# Patient Record
Sex: Female | Born: 1980 | Race: White | Hispanic: No | Marital: Single | Smoking: Current every day smoker
Health system: Southern US, Community
[De-identification: ages and names within clinical notes are randomized; demographics above are authoritative.]

## PROBLEM LIST (undated history)

## (undated) DIAGNOSIS — N200 Calculus of kidney: Secondary | ICD-10-CM

## (undated) DIAGNOSIS — Q359 Cleft palate, unspecified: Secondary | ICD-10-CM

## (undated) DIAGNOSIS — Z9889 Other specified postprocedural states: Secondary | ICD-10-CM

---

## 2004-08-07 ENCOUNTER — Emergency Department: Payer: Self-pay | Admitting: Emergency Medicine

## 2004-09-14 ENCOUNTER — Emergency Department: Payer: Self-pay | Admitting: Emergency Medicine

## 2005-03-30 ENCOUNTER — Emergency Department: Payer: Self-pay | Admitting: Emergency Medicine

## 2005-05-10 ENCOUNTER — Emergency Department: Payer: Self-pay | Admitting: Emergency Medicine

## 2006-01-08 ENCOUNTER — Emergency Department: Payer: Self-pay | Admitting: Internal Medicine

## 2006-01-11 ENCOUNTER — Emergency Department: Payer: Self-pay | Admitting: General Practice

## 2006-01-14 ENCOUNTER — Ambulatory Visit: Payer: Self-pay | Admitting: Emergency Medicine

## 2006-12-11 ENCOUNTER — Emergency Department: Payer: Self-pay

## 2007-08-26 ENCOUNTER — Emergency Department: Payer: Self-pay | Admitting: Internal Medicine

## 2008-01-17 ENCOUNTER — Emergency Department: Payer: Self-pay | Admitting: Emergency Medicine

## 2008-07-07 ENCOUNTER — Emergency Department: Payer: Self-pay | Admitting: Emergency Medicine

## 2008-08-23 ENCOUNTER — Emergency Department: Payer: Self-pay | Admitting: Internal Medicine

## 2009-03-02 ENCOUNTER — Emergency Department: Payer: Self-pay | Admitting: Emergency Medicine

## 2009-12-03 ENCOUNTER — Emergency Department: Payer: Self-pay | Admitting: Internal Medicine

## 2010-03-02 ENCOUNTER — Emergency Department: Payer: Self-pay | Admitting: Emergency Medicine

## 2010-03-07 ENCOUNTER — Emergency Department: Payer: Self-pay | Admitting: Emergency Medicine

## 2010-03-12 ENCOUNTER — Ambulatory Visit: Payer: Self-pay | Admitting: Urology

## 2010-03-13 ENCOUNTER — Ambulatory Visit: Payer: Self-pay | Admitting: Urology

## 2010-09-10 ENCOUNTER — Emergency Department: Payer: Self-pay | Admitting: Emergency Medicine

## 2010-12-13 ENCOUNTER — Emergency Department (HOSPITAL_COMMUNITY)
Admission: EM | Admit: 2010-12-13 | Discharge: 2010-12-13 | Disposition: A | Discharge status: Home / Self Care | Payer: Self-pay | Attending: Emergency Medicine | Admitting: Emergency Medicine

## 2010-12-13 ENCOUNTER — Encounter (HOSPITAL_COMMUNITY): Payer: Self-pay | Admitting: Radiology

## 2010-12-13 ENCOUNTER — Emergency Department (HOSPITAL_COMMUNITY): Payer: Self-pay

## 2010-12-13 DIAGNOSIS — M545 Low back pain, unspecified: Secondary | ICD-10-CM | POA: Insufficient documentation

## 2010-12-13 DIAGNOSIS — R319 Hematuria, unspecified: Secondary | ICD-10-CM | POA: Insufficient documentation

## 2010-12-13 DIAGNOSIS — R109 Unspecified abdominal pain: Secondary | ICD-10-CM | POA: Insufficient documentation

## 2010-12-13 HISTORY — DX: Calculus of kidney: N20.0

## 2010-12-13 HISTORY — DX: Other specified postprocedural states: Z98.890

## 2010-12-13 LAB — POCT I-STAT, CHEM 8
Creatinine, Ser: 0.8 mg/dL (ref 0.4–1.2)
HCT: 59 % — ABNORMAL HIGH (ref 36.0–46.0)
Hemoglobin: 20.1 g/dL — ABNORMAL HIGH (ref 12.0–15.0)
Potassium: 3.8 mEq/L (ref 3.5–5.1)
Sodium: 138 mEq/L (ref 135–145)

## 2010-12-13 LAB — URINALYSIS, ROUTINE W REFLEX MICROSCOPIC
Bilirubin Urine: NEGATIVE
Ketones, ur: NEGATIVE mg/dL
Nitrite: NEGATIVE
Protein, ur: NEGATIVE mg/dL
pH: 7 (ref 5.0–8.0)

## 2010-12-13 LAB — POCT PREGNANCY, URINE: Preg Test, Ur: NEGATIVE

## 2010-12-13 LAB — URINE MICROSCOPIC-ADD ON

## 2011-02-02 ENCOUNTER — Emergency Department (HOSPITAL_COMMUNITY): Payer: Self-pay

## 2011-02-02 ENCOUNTER — Emergency Department (HOSPITAL_COMMUNITY)
Admission: EM | Admit: 2011-02-02 | Discharge: 2011-02-02 | Disposition: A | Discharge status: Home / Self Care | Payer: Self-pay | Attending: Emergency Medicine | Admitting: Emergency Medicine

## 2011-02-02 DIAGNOSIS — G8929 Other chronic pain: Secondary | ICD-10-CM | POA: Insufficient documentation

## 2011-02-02 DIAGNOSIS — R079 Chest pain, unspecified: Secondary | ICD-10-CM | POA: Insufficient documentation

## 2011-02-02 DIAGNOSIS — M546 Pain in thoracic spine: Secondary | ICD-10-CM | POA: Insufficient documentation

## 2011-02-02 DIAGNOSIS — W19XXXA Unspecified fall, initial encounter: Secondary | ICD-10-CM | POA: Insufficient documentation

## 2011-02-02 DIAGNOSIS — Y929 Unspecified place or not applicable: Secondary | ICD-10-CM | POA: Insufficient documentation

## 2011-07-07 ENCOUNTER — Emergency Department: Payer: Self-pay | Admitting: Emergency Medicine

## 2011-07-29 ENCOUNTER — Emergency Department: Payer: Self-pay | Admitting: Emergency Medicine

## 2012-09-03 ENCOUNTER — Encounter (HOSPITAL_COMMUNITY): Payer: Self-pay | Admitting: Emergency Medicine

## 2012-09-03 ENCOUNTER — Emergency Department (HOSPITAL_COMMUNITY)
Admission: EM | Admit: 2012-09-03 | Discharge: 2012-09-03 | Disposition: A | Discharge status: Home / Self Care | Payer: Self-pay | Attending: Emergency Medicine | Admitting: Emergency Medicine

## 2012-09-03 DIAGNOSIS — R21 Rash and other nonspecific skin eruption: Secondary | ICD-10-CM | POA: Insufficient documentation

## 2012-09-03 DIAGNOSIS — Z87442 Personal history of urinary calculi: Secondary | ICD-10-CM | POA: Insufficient documentation

## 2012-09-03 DIAGNOSIS — K0889 Other specified disorders of teeth and supporting structures: Secondary | ICD-10-CM

## 2012-09-03 DIAGNOSIS — K089 Disorder of teeth and supporting structures, unspecified: Secondary | ICD-10-CM | POA: Insufficient documentation

## 2012-09-03 MED ORDER — HYDROCODONE-ACETAMINOPHEN 5-325 MG PO TABS
1.0000 | ORAL_TABLET | Freq: Once | ORAL | Status: AC
  Administered 2012-09-03: 1 via ORAL
  Filled 2012-09-03: qty 1

## 2012-09-03 MED ORDER — HYDROCODONE-ACETAMINOPHEN 5-500 MG PO TABS: 1.0000 | ORAL_TABLET | Freq: Four times a day (QID) | ORAL | Status: DC | PRN

## 2012-09-03 MED ORDER — PENICILLIN V POTASSIUM 500 MG PO TABS: 500.0000 mg | ORAL_TABLET | Freq: Three times a day (TID) | ORAL | Status: DC

## 2012-09-03 MED ORDER — PENICILLIN V POTASSIUM 250 MG PO TABS
500.0000 mg | ORAL_TABLET | Freq: Once | ORAL | Status: AC
  Administered 2012-09-03: 500 mg via ORAL
  Filled 2012-09-03: qty 2

## 2012-09-03 NOTE — ED Provider Notes (Signed)
Medical screening examination/treatment/procedure(s) were performed by non-physician practitioner and as supervising physician I was immediately available for consultation/collaboration.   Bernerd Terhune H Neilan Rizzo, MD 09/03/12 1532 

## 2012-09-03 NOTE — ED Notes (Signed)
Pt c/o left upper dental pain x 3 days 

## 2012-09-03 NOTE — ED Provider Notes (Signed)
History     CSN: [id number]  Arrival date & time 09/03/12  4098   First MD Initiated Contact with Patient 09/03/12 [phone]      Chief Complaint  Patient presents with  . Dental Pain    (Consider location/radiation/quality/duration/timing/severity/associated sxs/prior treatment) HPI  31 year old female presents complaining of dental pain.  Patient reports she has had dental avulsion and dental decay to the left upper tooth in the past several months. 3 days ago while eating she accidentally chipped the same tooth.  Has been having sharp throbbing pain since.  Onset is gradual, intermittent but now persistent, moderate in severity, non radiating, worsening with eating or talking.  Denies fever, headache, ear pain, rash, n/v/d.  Has not been seen by a dentist.     Past Medical History  Diagnosis Date  . Kidney calculi   . History of lithotripsy     History reviewed. No pertinent past surgical history.  History reviewed. No pertinent family history.  History  Substance Use Topics  . Smoking status: Not on file  . Smokeless tobacco: Not on file  . Alcohol Use: Not on file    OB History    Grav Para Term Preterm Abortions TAB SAB Ect Mult Living                  Review of Systems  Constitutional: Negative for fever.  Musculoskeletal: Negative for myalgias, joint swelling and arthralgias.  Skin: Positive for rash. Negative for wound.  Neurological: Negative for numbness and headaches.    Allergies  Review of patient's allergies indicates no known allergies.  Home Medications  No current outpatient prescriptions on file.  BP 118/70  Pulse 99  Temp 98.6 F (37 C) (Oral)  Resp 18  SpO2 100%  Physical Exam  Nursing note and vitals reviewed. Constitutional: She is oriented to person, place, and time. She appears well-developed and well-nourished. No distress.  HENT:  Head: Atraumatic.  Mouth/Throat: Oropharynx is clear and moist.    Eyes: Conjunctivae normal  are normal.  Neck: Normal range of motion. Neck supple.  Musculoskeletal: Normal range of motion.  Lymphadenopathy:    She has no cervical adenopathy.  Neurological: She is alert and oriented to person, place, and time.  Skin: Skin is warm. No rash noted.  Psychiatric: She has a normal mood and affect.    ED Course  Procedures (including critical care time)  Labs Reviewed - No data to display No results found.   No diagnosis found.  1. Dental pain  MDM  Tenderness to L upper canine with evidence of dental decay.  Will d/c with dental f/u, PCN and pain med.  Pt otherwise has no trismus, no deep tissue infection.     BP 118/70  Pulse 99  Temp 98.6 F (37 C) (Oral)  Resp 18  SpO2 100%       Fayrene Helper, PA-C 09/03/12 1002

## 2012-10-28 ENCOUNTER — Encounter (HOSPITAL_COMMUNITY): Payer: Self-pay | Admitting: *Deleted

## 2012-10-28 ENCOUNTER — Emergency Department (HOSPITAL_COMMUNITY)
Admission: EM | Admit: 2012-10-28 | Discharge: 2012-10-28 | Disposition: A | Discharge status: Home / Self Care | Payer: Self-pay | Attending: Emergency Medicine | Admitting: Emergency Medicine

## 2012-10-28 DIAGNOSIS — Z87442 Personal history of urinary calculi: Secondary | ICD-10-CM | POA: Insufficient documentation

## 2012-10-28 DIAGNOSIS — K089 Disorder of teeth and supporting structures, unspecified: Secondary | ICD-10-CM | POA: Insufficient documentation

## 2012-10-28 DIAGNOSIS — K0889 Other specified disorders of teeth and supporting structures: Secondary | ICD-10-CM

## 2012-10-28 DIAGNOSIS — F17200 Nicotine dependence, unspecified, uncomplicated: Secondary | ICD-10-CM | POA: Insufficient documentation

## 2012-10-28 MED ORDER — OXYCODONE-ACETAMINOPHEN 5-325 MG PO TABS: 1.0000 | ORAL_TABLET | Freq: Four times a day (QID) | ORAL | Status: DC | PRN

## 2012-10-28 MED ORDER — PENICILLIN V POTASSIUM 500 MG PO TABS: 500.0000 mg | ORAL_TABLET | Freq: Four times a day (QID) | ORAL | Status: AC

## 2012-10-28 NOTE — ED Provider Notes (Signed)
History     CSN: [id number]  Arrival date & time 10/28/12  1337   First MD Initiated Contact with Patient 10/28/12 1512      Chief Complaint  Patient presents with  . Dental Pain    (Consider location/radiation/quality/duration/timing/severity/associated sxs/prior treatment) HPI 32 year old female presents complaining of dental pain. Patient reports she has had dental avulsion and dental decay to the left upper tooth in the past several months. She accidentally chipped the same tooth. Has been having sharp throbbing pain since. Pt has been seen once before for this tooth. She says that she does now have dentist but that it is going to take some time to get in to the dentist and it has started to hurt very badly now. Onset is gradual, intermittent but now persistent, moderate in severity, non radiating, worsening with eating or talking. Denies fever, headache, ear pain, rash, n/v/d. Has not been seen by a dentist.     Past Medical History  Diagnosis Date  . Kidney calculi   . History of lithotripsy     History reviewed. No pertinent past surgical history.  No family history on file.  History  Substance Use Topics  . Smoking status: Current Every Day Smoker  . Smokeless tobacco: Not on file  . Alcohol Use: No    OB History    Grav Para Term Preterm Abortions TAB SAB Ect Mult Living                  Review of Systems  Review of Systems  Gen: no weight loss, fevers, chills, night sweats  Eyes: no discharge or drainage, no occular pain or visual changes  Nose: no epistaxis or rhinorrhea  Mouth: + dental pain, no sore throat  Neck: no neck pain  Lungs:No wheezing, coughing or hemoptysis CV: no chest pain, palpitations, dependent edema or orthopnea  Abd: no abdominal pain, nausea, vomiting  GU: no dysuria or gross hematuria  MSK:  No abnormalities  Neuro: no headache, no focal neurologic deficits  Skin: no abnormalities Psyche: negative.   Allergies   Hydrocodone  Home Medications  No current outpatient prescriptions on file.  BP 121/78  Pulse 94  Temp 98.8 F (37.1 C) (Oral)  Resp 18  SpO2 100%  Physical Exam  Constitutional: She appears well-developed and well-nourished. No distress.  HENT:  Head: Normocephalic and atraumatic.  Mouth/Throat: Uvula is midline, oropharynx is clear and moist and mucous membranes are normal. Normal dentition. Dental caries (Pts tooth shows no obvious abscess but moderate to severe tenderness to palpation of marked tooth) present. No uvula swelling.    Eyes: Pupils are equal, round, and reactive to light.  Neck: Trachea normal, normal range of motion and full passive range of motion without pain. Neck supple.  Cardiovascular: Normal rate, regular rhythm, normal heart sounds and normal pulses.   Pulmonary/Chest: Effort normal and breath sounds normal. No respiratory distress. Chest wall is not dull to percussion. She exhibits no tenderness, no crepitus, no edema, no deformity and no retraction.  Abdominal: Normal appearance.  Musculoskeletal: Normal range of motion.  Neurological: She is alert. She has normal strength.  Skin: Skin is warm, dry and intact. She is not diaphoretic.  Psychiatric: She has a normal mood and affect. Her speech is normal. Cognition and memory are normal.    ED Course  Procedures (including critical care time)  Labs Reviewed - No data to display No results found.   No diagnosis found. Dx; toothache  MDM  Pt given Rx for Percocets 5-325 (10 tabs) and Penicillin. Patient informed that they need to find a dentist and have the tooth pulled or the symptoms may be reoccurring. A Resource guide has been given with dental providers. Patient has been given return to ED precautions.         Dorthula Matas, PA 10/28/12 1527

## 2012-10-28 NOTE — ED Provider Notes (Signed)
Medical screening examination/treatment/procedure(s) were performed by non-physician practitioner and as supervising physician I was immediately available for consultation/collaboration.   Rolan Bucco, MD 10/28/12 1600

## 2012-10-28 NOTE — ED Notes (Signed)
Pt is here with right upper tooth pain where pecan pie broke tooth off

## 2012-12-06 ENCOUNTER — Emergency Department: Payer: Self-pay | Admitting: Emergency Medicine

## 2012-12-06 LAB — URINALYSIS, COMPLETE
Bilirubin,UR: NEGATIVE
Glucose,UR: NEGATIVE mg/dL (ref 0–75)
Nitrite: NEGATIVE
RBC,UR: 3 /HPF (ref 0–5)
Specific Gravity: 1.03 (ref 1.003–1.030)

## 2012-12-06 LAB — CBC
MCH: 30.7 pg (ref 26.0–34.0)
Platelet: 299 10*3/uL (ref 150–440)
RDW: 12.9 % (ref 11.5–14.5)
WBC: 10.1 10*3/uL (ref 3.6–11.0)

## 2012-12-06 LAB — HCG, QUANTITATIVE, PREGNANCY: Beta Hcg, Quant.: 4809 m[IU]/mL — ABNORMAL HIGH

## 2012-12-06 LAB — WET PREP, GENITAL

## 2013-10-16 ENCOUNTER — Encounter (HOSPITAL_COMMUNITY): Payer: Self-pay | Admitting: Emergency Medicine

## 2013-10-16 ENCOUNTER — Emergency Department (HOSPITAL_COMMUNITY)
Admission: EM | Admit: 2013-10-16 | Discharge: 2013-10-16 | Disposition: A | Discharge status: Home / Self Care | Payer: Medicaid Other | Attending: Emergency Medicine | Admitting: Emergency Medicine

## 2013-10-16 DIAGNOSIS — Q359 Cleft palate, unspecified: Secondary | ICD-10-CM | POA: Insufficient documentation

## 2013-10-16 DIAGNOSIS — Z8709 Personal history of other diseases of the respiratory system: Secondary | ICD-10-CM | POA: Insufficient documentation

## 2013-10-16 DIAGNOSIS — F17200 Nicotine dependence, unspecified, uncomplicated: Secondary | ICD-10-CM | POA: Insufficient documentation

## 2013-10-16 DIAGNOSIS — K0889 Other specified disorders of teeth and supporting structures: Secondary | ICD-10-CM

## 2013-10-16 DIAGNOSIS — Z87442 Personal history of urinary calculi: Secondary | ICD-10-CM | POA: Insufficient documentation

## 2013-10-16 DIAGNOSIS — K089 Disorder of teeth and supporting structures, unspecified: Secondary | ICD-10-CM | POA: Insufficient documentation

## 2013-10-16 MED ORDER — OXYCODONE-ACETAMINOPHEN 5-325 MG PO TABS: 1.0000 | ORAL_TABLET | ORAL | Status: DC | PRN

## 2013-10-16 MED ORDER — PENICILLIN V POTASSIUM 500 MG PO TABS: 500.0000 mg | ORAL_TABLET | Freq: Four times a day (QID) | ORAL | Status: AC

## 2013-10-16 MED ORDER — OXYCODONE-ACETAMINOPHEN 5-325 MG PO TABS
1.0000 | ORAL_TABLET | Freq: Once | ORAL | Status: AC
  Administered 2013-10-16: 1 via ORAL
  Filled 2013-10-16: qty 1

## 2013-10-16 NOTE — ED Notes (Signed)
Pt. Stated, My wholert. Side of my jaw hurts.  I didn't have bad teeth til i was pregnant with my daughter.

## 2013-10-16 NOTE — ED Provider Notes (Signed)
CSN: [id number]     Arrival date & time 10/16/13  1050 History  This chart was scribed for non-physician practitioner, Jillyn Ledger, PA-C, working with Suzi Roots, MD by Smiley Houseman, ED Scribe. This patient was seen in room TR06C/TR06C and the patient's care was started at 11:47 AM.   Chief Complaint  Patient presents with  . Dental Pain   The history is provided by the patient. No language interpreter was used.   HPI Comments: Angelica Sampson is a 32 y.o. female with a PMH of kidney calculi who presents to the Emergency Department complaining of constant diffuse severe right sided dental pain, which began this morning. Pain is located in the right upper and lower jaw. She reports that she has a decayed tooth in her right upper mouth.  Patient has a cleft palate and states her voice is muffled at baseline with no acute changes.  Patient states she had a throat infection in 2010, which left her in this condition.  Patient denies any fever, chills, abdominal pain, vomiting, headache, and dysphagia. Patient is a smoker and contracted gum disease while she was pregnant with her son.  Patient reports that she gets nausea and pruritis after taking hydrocodone.    Past Medical History  Diagnosis Date  . Kidney calculi   . History of lithotripsy    History reviewed. No pertinent past surgical history. No family history on file. History  Substance Use Topics  . Smoking status: Current Every Day Smoker  . Smokeless tobacco: Not on file  . Alcohol Use: No   OB History   Grav Para Term Preterm Abortions TAB SAB Ect Mult Living                 Review of Systems  Constitutional: Negative for fever and chills.  HENT: Positive for dental problem. Negative for trouble swallowing.   Gastrointestinal: Negative for nausea, vomiting and abdominal pain.  Neurological: Negative for headaches.  All other systems reviewed and are negative.   Allergies  Hydrocodone  Home Medications    Current Outpatient Rx  Name  Route  Sig  Dispense  Refill  . ibuprofen (ADVIL,MOTRIN) 200 MG tablet   Oral   Take 400 mg by mouth every 6 (six) hours as needed for fever or moderate pain.          Triage Vitals: BP 130/87  Pulse 86  Temp(Src) 98.2 F (36.8 C) (Oral)  Resp 17  Wt 131 lb 2 oz (59.478 kg)  SpO2 100%  LMP 11/16/2012  Filed Vitals:   10/16/13 1056  BP: 130/87  Pulse: 86  Temp: 98.2 F (36.8 C)  TempSrc: Oral  Resp: 17  Weight: 131 lb 2 oz (59.478 kg)  SpO2: 100%     Physical Exam  Nursing note and vitals reviewed. Constitutional: She is oriented to person, place, and time. She appears well-developed and well-nourished. No distress.  HENT:  Head: Normocephalic and atraumatic.  Right Ear: External ear normal.  Left Ear: External ear normal.  Nose: Nose normal.  Mouth/Throat: Oropharynx is clear and moist. No oropharyngeal exudate.    Poor dentition throughout.  No dental abscess.  Patient reports pain with palpation to the molars on the upper right side.  No loose dentition/trauma.  No tenderness to palpation to the face throughout.  No palpable masses/edema.  TM's gray and translucent bilaterally.  No trismus.  Posterior pharynx is absent with no uvula.  Nasal voice.    Eyes:  Conjunctivae and EOM are normal. Pupils are equal, round, and reactive to light. Right eye exhibits no discharge. Left eye exhibits no discharge.  Neck: Normal range of motion. Neck supple. No tracheal deviation present.  No tenderness to palpation to the neck throughout.  No palpable masses/LAD.  No submental fullness.    Cardiovascular: Normal rate, regular rhythm and normal heart sounds.  Exam reveals no gallop and no friction rub.   No murmur heard. Pulmonary/Chest: Effort normal. No respiratory distress. She has no wheezes. She has no rales. She exhibits no tenderness.  Abdominal: Soft. She exhibits no distension. There is no tenderness.  Musculoskeletal: Normal range of  motion. She exhibits no edema and no tenderness.  Neurological: She is alert and oriented to person, place, and time.  Skin: Skin is warm and dry. She is not diaphoretic.  Psychiatric: She has a normal mood and affect. Her behavior is normal.    ED Course  Procedures (including critical care time) DIAGNOSTIC STUDIES: Oxygen Saturation is 100% on room air, normal by my interpretation.    COORDINATION OF CARE: 11:59 AM-Will give antibiotics and oxycodone for pain.  Informed patient to bottle feed while on the antibiotics and pain narcotics.  Will provide referral to dentistry.  Patient informed of current plan of treatment and evaluation and agrees with plan.     MDM   BRELAND ELDERS is a 32 y.o. female with a PMH of kidney calculi who presents to the Emergency Department complaining of constant diffuse severe right sided dental pain, which began this morning.   Etiology of dental pain possibly due to dental caries.  No concerning signs/symptoms for Ludwig's Angina at this time.  No dental abscess.  Patient afebrile and non-toxic in appearance.  Patient instructed to follow-up with a dentist for further evaluation and management.  Resources provided.  Return precautions were given.  Patient in agreement with discharge and plan. Patient obtaining ride home from ED.     Discharge Medication List as of 10/16/2013 12:03 PM    START taking these medications   Details  oxyCODONE-acetaminophen (PERCOCET/ROXICET) 5-325 MG per tablet Take 1-2 tablets by mouth every 4 (four) hours as needed for severe pain., Starting 10/16/2013, Until Discontinued, Print    penicillin v potassium (VEETID) 500 MG tablet Take 1 tablet (500 mg total) by mouth 4 (four) times daily., Starting 10/16/2013, Last dose on Sat 10/23/13, Print        Final impressions: 1. Pain, dental      Thomasenia Sales    I personally performed the services described in this documentation, which was scribed in my  presence. The recorded information has been reviewed and is accurate.       Jillyn Ledger, PA-C 10/17/13 2047

## 2013-10-19 NOTE — ED Provider Notes (Signed)
Medical screening examination/treatment/procedure(s) were performed by non-physician practitioner and as supervising physician I was immediately available for consultation/collaboration.  EKG Interpretation   None         Suzi Roots, MD 10/19/13 [phone]

## 2014-01-15 ENCOUNTER — Emergency Department: Payer: Self-pay | Admitting: Emergency Medicine

## 2014-10-04 ENCOUNTER — Emergency Department: Payer: Self-pay | Admitting: Internal Medicine

## 2015-03-20 DIAGNOSIS — K088 Other specified disorders of teeth and supporting structures: Secondary | ICD-10-CM | POA: Diagnosis present

## 2015-03-20 DIAGNOSIS — Z720 Tobacco use: Secondary | ICD-10-CM | POA: Insufficient documentation

## 2015-03-20 DIAGNOSIS — K029 Dental caries, unspecified: Secondary | ICD-10-CM | POA: Insufficient documentation

## 2015-03-21 ENCOUNTER — Emergency Department
Admission: EM | Admit: 2015-03-21 | Discharge: 2015-03-21 | Disposition: A | Discharge status: Home / Self Care | Payer: Medicaid Other | Attending: Emergency Medicine | Admitting: Emergency Medicine

## 2015-03-21 DIAGNOSIS — K029 Dental caries, unspecified: Secondary | ICD-10-CM

## 2015-03-21 MED ORDER — AMOXICILLIN 500 MG PO CAPS
500.0000 mg | ORAL_CAPSULE | Freq: Once | ORAL | Status: AC
  Administered 2015-03-21: 500 mg via ORAL

## 2015-03-21 MED ORDER — LIDOCAINE VISCOUS 2 % MT SOLN
15.0000 mL | Freq: Once | OROMUCOSAL | Status: AC
  Administered 2015-03-21: 15 mL via OROMUCOSAL

## 2015-03-21 MED ORDER — KETOROLAC TROMETHAMINE 10 MG PO TABS
10.0000 mg | ORAL_TABLET | Freq: Once | ORAL | Status: AC
  Administered 2015-03-21: 10 mg via ORAL

## 2015-03-21 MED ORDER — AMOXICILLIN 500 MG PO CAPS
ORAL_CAPSULE | ORAL | Status: AC
  Administered 2015-03-21: 500 mg via ORAL
  Filled 2015-03-21: qty 1

## 2015-03-21 MED ORDER — KETOROLAC TROMETHAMINE 10 MG PO TABS: 10.0000 mg | ORAL_TABLET | Freq: Three times a day (TID) | ORAL | Status: DC | PRN

## 2015-03-21 MED ORDER — AMOXICILLIN 500 MG PO CAPS: 500.0000 mg | ORAL_CAPSULE | Freq: Two times a day (BID) | ORAL | Status: DC

## 2015-03-21 MED ORDER — KETOROLAC TROMETHAMINE 10 MG PO TABS
ORAL_TABLET | ORAL | Status: AC
  Administered 2015-03-21: 10 mg via ORAL
  Filled 2015-03-21: qty 1

## 2015-03-21 MED ORDER — LIDOCAINE VISCOUS 2 % MT SOLN
OROMUCOSAL | Status: AC
Start: 2015-03-21 — End: 2015-03-21
  Administered 2015-03-21: 15 mL via OROMUCOSAL
  Filled 2015-03-21: qty 15

## 2015-03-21 NOTE — ED Provider Notes (Signed)
[facility] Emergency Department Provider Note  ____________________________________________  Time seen: 3:30 AM  I have reviewed the triage vital signs and the nursing notes.   HISTORY  Chief Complaint Dental Pain      HPI Angelica Sampson is a 34 y.o. female presents with multiple painful dental caries 2 weeks. Patient denies any fever no difficulty swallowing no nausea no vomiting     Past Medical History  Diagnosis Date  . Kidney calculi   . History of lithotripsy     There are no active problems to display for this patient.   No past surgical history on file.  Current Outpatient Rx  Name  Route  Sig  Dispense  Refill  . amoxicillin (AMOXIL) 500 MG capsule   Oral   Take 1 capsule (500 mg total) by mouth 2 (two) times daily.   20 capsule   0   . ibuprofen (ADVIL,MOTRIN) 200 MG tablet   Oral   Take 400 mg by mouth every 6 (six) hours as needed for fever or moderate pain.         Marland Kitchen. ketorolac (TORADOL) 10 MG tablet   Oral   Take 1 tablet (10 mg total) by mouth every 8 (eight) hours as needed.   20 tablet   0   . oxyCODONE-acetaminophen (PERCOCET/ROXICET) 5-325 MG per tablet   Oral   Take 1-2 tablets by mouth every 4 (four) hours as needed for severe pain.   10 tablet   0     Allergies Hydrocodone  No family history on file.  Social History History  Substance Use Topics  . Smoking status: Current Every Day Smoker  . Smokeless tobacco: Not on file  . Alcohol Use: No    Review of Systems  Constitutional: Negative for fever. Eyes: Negative for visual changes. ENT: Negative for sore throat. Positive dental pain Cardiovascular: Negative for chest pain. Respiratory: Negative for shortness of breath. Gastrointestinal: Negative for abdominal pain, vomiting and diarrhea. Genitourinary: Negative for dysuria. Musculoskeletal: Negative for back pain. Skin: Negative for rash. Neurological: Negative for headaches, focal  weakness or numbness.   10-point ROS otherwise negative.  ____________________________________________   PHYSICAL EXAM:  VITAL SIGNS: ED Triage Vitals  Enc Vitals Group     BP 03/21/15 0031 131/72 mmHg     Pulse Rate 03/21/15 0031 88     Resp 03/21/15 0031 18     Temp 03/21/15 0031 97.7 F (36.5 C)     Temp Source 03/21/15 0031 Oral     SpO2 03/21/15 0031 99 %     Weight 03/21/15 0031 122 lb (55.339 kg)     Height 03/21/15 0031 5\' 5"  (1.651 m)     Head Cir --      Peak Flow --      Pain Score 03/21/15 0032 6     Pain Loc --      Pain Edu? --      Excl. in GC? --     Constitutional: Alert and oriented. Well appearing and in no distress. Eyes: Conjunctivae are normal. PERRL. Normal extraocular movements. ENT   Head: Normocephalic and atraumatic.   Nose: No congestion/rhinnorhea.   Mouth/Throat: Mucous membranes are moist. Multiple dental caries on maxilla and mandible   Neck: No stridor. Psychiatric: Mood and affect are normal. Speech and behavior are normal. Patient exhibits appropriate insight and judgment.  ____________________________________________       _   INITIAL IMPRESSION / ASSESSMENT AND PLAN /  ED COURSE  Pertinent labs & imaging results that were available during my care of the patient were reviewed by me and considered in my medical decision making (see chart for details).  Street physical exam consistent with multiple dental caries patient received analgesia and amoxicillin emergency department with referrals to dentists.  ____________________________________________   FINAL CLINICAL IMPRESSION(S) / ED DIAGNOSES  Final diagnoses:  Dental caries      Darci Current, MD 03/21/15 [phone]

## 2015-03-21 NOTE — ED Notes (Signed)
Meds given per md order, pt discharged home

## 2015-03-21 NOTE — ED Notes (Signed)
Pt in with toothache x 2 weeks has muffled voice but states is normal for her due to cleft palate.

## 2015-03-21 NOTE — Discharge Instructions (Signed)
Dental Caries  Dental caries (also called tooth decay) is the most common oral disease. It can occur at any age but is more common in children and young adults.   HOW DENTAL CARIES DEVELOPS   The process of decay begins when bacteria and foods (particularly sugars and starches) combine in your mouth to produce plaque. Plaque is a substance that sticks to the hard, outer surface of a tooth (enamel). The bacteria in plaque produce acids that attack enamel. These acids may also attack the root surface of a tooth (cementum) if it is exposed. Repeated attacks dissolve these surfaces and create holes in the tooth (cavities). If left untreated, the acids destroy the other layers of the tooth.   RISK FACTORS   Frequent sipping of sugary beverages.    Frequent snacking on sugary and starchy foods, especially those that easily get stuck in the teeth.    Poor oral hygiene.    Dry mouth.    Substance abuse such as methamphetamine abuse.    Broken or poor-fitting dental restorations.    Eating disorders.    Gastroesophageal reflux disease (GERD).    Certain radiation treatments to the head and neck.  SYMPTOMS  In the early stages of dental caries, symptoms are seldom present. Sometimes white, chalky areas may be seen on the enamel or other tooth layers. In later stages, symptoms may include:   Pits and holes on the enamel.   Toothache after sweet, hot, or cold foods or drinks are consumed.   Pain around the tooth.   Swelling around the tooth.  DIAGNOSIS   Most of the time, dental caries is detected during a regular dental checkup. A diagnosis is made after a thorough medical and dental history is taken and the surfaces of your teeth are checked for signs of dental caries. Sometimes special instruments, such as lasers, are used to check for dental caries. Dental X-ray exams may be taken so that areas not visible to the eye (such as between the contact areas of the teeth) can be checked for cavities.    TREATMENT   If dental caries is in its early stages, it may be reversed with a fluoride treatment or an application of a remineralizing agent at the dental office. Thorough brushing and flossing at home is needed to aid these treatments. If it is in its later stages, treatment depends on the location and extent of tooth destruction:    If a small area of the tooth has been destroyed, the destroyed area will be removed and cavities will be filled with a material such as gold, silver amalgam, or composite resin.    If a large area of the tooth has been destroyed, the destroyed area will be removed and a cap (crown) will be fitted over the remaining tooth structure.    If the center part of the tooth (pulp) is affected, a procedure called a root canal will be needed before a filling or crown can be placed.    If most of the tooth has been destroyed, the tooth may need to be pulled (extracted).  HOME CARE INSTRUCTIONS  You can prevent, stop, or reverse dental caries at home by practicing good oral hygiene. Good oral hygiene includes:   Thoroughly cleaning your teeth at least twice a day with a toothbrush and dental floss.    Using a fluoride toothpaste. A fluoride mouth rinse may also be used if recommended by your dentist or health care provider.    Restricting   the amount of sugary and starchy foods and sugary liquids you consume.    Avoiding frequent snacking on these foods and sipping of these liquids.    Keeping regular visits with a dentist for checkups and cleanings.  PREVENTION    Practice good oral hygiene.   Consider a dental sealant. A dental sealant is a coating material that is applied by your dentist to the pits and grooves of teeth. The sealant prevents food from being trapped in them. It may protect the teeth for several years.   Ask about fluoride supplements if you live in a community without fluorinated water or with water that has a low fluoride content. Use fluoride supplements  as directed by your dentist or health care provider.   Allow fluoride varnish applications to teeth if directed by your dentist or health care provider.  Document Released: 07/06/2002 Document Revised: 02/28/2014 Document Reviewed: 10/16/2012  [employer] Patient Information 2015 [employer]. This information is not intended to replace advice given to you by your health care provider. Make sure you discuss any questions you have with your health care provider.

## 2015-03-30 ENCOUNTER — Emergency Department: Payer: Medicaid Other

## 2015-03-30 ENCOUNTER — Emergency Department
Admission: EM | Admit: 2015-03-30 | Discharge: 2015-03-30 | Disposition: A | Discharge status: Home / Self Care | Payer: Medicaid Other | Attending: Emergency Medicine | Admitting: Emergency Medicine

## 2015-03-30 DIAGNOSIS — K029 Dental caries, unspecified: Secondary | ICD-10-CM | POA: Insufficient documentation

## 2015-03-30 DIAGNOSIS — Y998 Other external cause status: Secondary | ICD-10-CM | POA: Insufficient documentation

## 2015-03-30 DIAGNOSIS — Z720 Tobacco use: Secondary | ICD-10-CM | POA: Diagnosis not present

## 2015-03-30 DIAGNOSIS — Z79899 Other long term (current) drug therapy: Secondary | ICD-10-CM | POA: Diagnosis not present

## 2015-03-30 DIAGNOSIS — S0083XA Contusion of other part of head, initial encounter: Secondary | ICD-10-CM | POA: Diagnosis not present

## 2015-03-30 DIAGNOSIS — Y9389 Activity, other specified: Secondary | ICD-10-CM | POA: Diagnosis not present

## 2015-03-30 DIAGNOSIS — J320 Chronic maxillary sinusitis: Secondary | ICD-10-CM | POA: Diagnosis not present

## 2015-03-30 DIAGNOSIS — S0993XA Unspecified injury of face, initial encounter: Secondary | ICD-10-CM | POA: Diagnosis present

## 2015-03-30 DIAGNOSIS — Y9289 Other specified places as the place of occurrence of the external cause: Secondary | ICD-10-CM | POA: Insufficient documentation

## 2015-03-30 DIAGNOSIS — Z792 Long term (current) use of antibiotics: Secondary | ICD-10-CM | POA: Insufficient documentation

## 2015-03-30 MED ORDER — OXYCODONE-ACETAMINOPHEN 5-325 MG PO TABS: 1.0000 | ORAL_TABLET | Freq: Four times a day (QID) | ORAL | Status: DC | PRN

## 2015-03-30 MED ORDER — OXYCODONE-ACETAMINOPHEN 5-325 MG PO TABS
1.0000 | ORAL_TABLET | Freq: Once | ORAL | Status: AC
  Administered 2015-03-30: 1 via ORAL

## 2015-03-30 MED ORDER — OXYCODONE-ACETAMINOPHEN 5-325 MG PO TABS
ORAL_TABLET | ORAL | Status: AC
  Filled 2015-03-30: qty 1

## 2015-03-30 MED ORDER — IBUPROFEN 800 MG PO TABS
800.0000 mg | ORAL_TABLET | Freq: Once | ORAL | Status: AC
  Administered 2015-03-30: 800 mg via ORAL

## 2015-03-30 MED ORDER — IBUPROFEN 800 MG PO TABS: 800.0000 mg | ORAL_TABLET | Freq: Three times a day (TID) | ORAL | Status: DC | PRN

## 2015-03-30 MED ORDER — IBUPROFEN 800 MG PO TABS
ORAL_TABLET | ORAL | Status: AC
  Filled 2015-03-30: qty 1
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
## 2015-03-30 NOTE — ED Notes (Signed)
Pt informed to return if any life threatening symptoms occur.  

## 2015-09-23 ENCOUNTER — Encounter: Payer: Self-pay | Admitting: Medical Oncology

## 2015-09-23 ENCOUNTER — Emergency Department: Payer: Medicaid Other

## 2015-09-23 ENCOUNTER — Emergency Department
Admission: EM | Admit: 2015-09-23 | Discharge: 2015-09-23 | Disposition: A | Discharge status: Home / Self Care | Payer: Medicaid Other | Attending: Emergency Medicine | Admitting: Emergency Medicine

## 2015-09-23 DIAGNOSIS — Z3492 Encounter for supervision of normal pregnancy, unspecified, second trimester: Secondary | ICD-10-CM

## 2015-09-23 DIAGNOSIS — R109 Unspecified abdominal pain: Secondary | ICD-10-CM

## 2015-09-23 DIAGNOSIS — O9989 Other specified diseases and conditions complicating pregnancy, childbirth and the puerperium: Secondary | ICD-10-CM | POA: Diagnosis not present

## 2015-09-23 DIAGNOSIS — Z792 Long term (current) use of antibiotics: Secondary | ICD-10-CM | POA: Insufficient documentation

## 2015-09-23 DIAGNOSIS — R1031 Right lower quadrant pain: Secondary | ICD-10-CM | POA: Diagnosis not present

## 2015-09-23 DIAGNOSIS — Z3A15 15 weeks gestation of pregnancy: Secondary | ICD-10-CM | POA: Diagnosis not present

## 2015-09-23 DIAGNOSIS — O99332 Smoking (tobacco) complicating pregnancy, second trimester: Secondary | ICD-10-CM | POA: Diagnosis not present

## 2015-09-23 DIAGNOSIS — R110 Nausea: Secondary | ICD-10-CM | POA: Diagnosis not present

## 2015-09-23 DIAGNOSIS — F17200 Nicotine dependence, unspecified, uncomplicated: Secondary | ICD-10-CM | POA: Diagnosis not present

## 2015-09-23 LAB — CBC
HCT: 39.2 % (ref 35.0–47.0)
HEMOGLOBIN: 13.1 g/dL (ref 12.0–16.0)
MCH: 28.4 pg (ref 26.0–34.0)
MCHC: 33.4 g/dL (ref 32.0–36.0)
MCV: 84.9 fL (ref 80.0–100.0)
Platelets: 240 10*3/uL (ref 150–440)
RBC: 4.62 MIL/uL (ref 3.80–5.20)
RDW: 14.8 % — ABNORMAL HIGH (ref 11.5–14.5)
WBC: 11.4 10*3/uL — AB (ref 3.6–11.0)

## 2015-09-23 LAB — COMPREHENSIVE METABOLIC PANEL
ALK PHOS: 62 U/L (ref 38–126)
ALT: 15 U/L (ref 14–54)
ANION GAP: 8 (ref 5–15)
AST: 20 U/L (ref 15–41)
Albumin: 3.4 g/dL — ABNORMAL LOW (ref 3.5–5.0)
BILIRUBIN TOTAL: 0.5 mg/dL (ref 0.3–1.2)
BUN: 12 mg/dL (ref 6–20)
CALCIUM: 9.1 mg/dL (ref 8.9–10.3)
CO2: 23 mmol/L (ref 22–32)
Chloride: 105 mmol/L (ref 101–111)
Creatinine, Ser: 0.56 mg/dL (ref 0.44–1.00)
GLUCOSE: 99 mg/dL (ref 65–99)
Potassium: 3.9 mmol/L (ref 3.5–5.1)
Sodium: 136 mmol/L (ref 135–145)
TOTAL PROTEIN: 7 g/dL (ref 6.5–8.1)

## 2015-09-23 LAB — LIPASE, BLOOD: Lipase: 23 U/L (ref 11–51)

## 2015-09-23 LAB — HCG, QUANTITATIVE, PREGNANCY: HCG, BETA CHAIN, QUANT, S: 45961 m[IU]/mL — AB (ref <5)

## 2015-09-23 MED ORDER — HYDROMORPHONE HCL 1 MG/ML IJ SOLN
1.0000 mg | Freq: Once | INTRAMUSCULAR | Status: AC
Start: 2015-09-23 — End: 2015-09-23
  Administered 2015-09-23: 1 mg via INTRAVENOUS
  Filled 2015-09-23: qty 1

## 2015-09-23 MED ORDER — ONDANSETRON HCL 4 MG PO TABS: ORAL_TABLET | ORAL | Status: DC

## 2015-09-23 MED ORDER — MORPHINE SULFATE (PF) 4 MG/ML IV SOLN
4.0000 mg | Freq: Once | INTRAVENOUS | Status: AC
  Administered 2015-09-23: 4 mg via INTRAVENOUS

## 2015-09-23 MED ORDER — ONDANSETRON HCL 4 MG/2ML IJ SOLN
INTRAMUSCULAR | Status: AC
  Administered 2015-09-23: 4 mg via INTRAVENOUS
  Filled 2015-09-23: qty 2

## 2015-09-23 MED ORDER — HYDROMORPHONE HCL 1 MG/ML IJ SOLN
1.0000 mg | INTRAMUSCULAR | Status: AC
  Administered 2015-09-23: 1 mg via INTRAVENOUS
  Filled 2015-09-23: qty 1

## 2015-09-23 MED ORDER — SODIUM CHLORIDE 0.9 % IV SOLN
Freq: Once | INTRAVENOUS | Status: AC
  Administered 2015-09-23: 13:00:00 via INTRAVENOUS

## 2015-09-23 MED ORDER — MORPHINE SULFATE (PF) 4 MG/ML IV SOLN
INTRAVENOUS | Status: AC
  Administered 2015-09-23: 4 mg via INTRAVENOUS
  Filled 2015-09-23: qty 1

## 2015-09-23 MED ORDER — MORPHINE SULFATE (PF) 4 MG/ML IV SOLN
4.0000 mg | Freq: Once | INTRAVENOUS | Status: AC
  Administered 2015-09-23: 4 mg via INTRAVENOUS
  Filled 2015-09-23: qty 1

## 2015-09-23 MED ORDER — ONDANSETRON HCL 4 MG/2ML IJ SOLN
4.0000 mg | Freq: Once | INTRAMUSCULAR | Status: AC
  Administered 2015-09-23: 4 mg via INTRAVENOUS

## 2015-09-23 MED ORDER — OXYCODONE-ACETAMINOPHEN 5-325 MG PO TABS: 1.0000 | ORAL_TABLET | ORAL | Status: DC | PRN

## 2015-09-23 NOTE — ED Provider Notes (Signed)
[facility] Emergency Department Provider Note  ____________________________________________  Time seen: Approximately 1:31 PM  I have reviewed the triage vital signs and the nursing notes.   HISTORY  Chief Complaint Abdominal Pain    HPI Angelica Sampson is a 34 y.o. female patient reports 3 days of right lower quadrant pain with no appetite no fever no vomiting no diarrhea. Pain has been unchanged since onset. Sharp stabbing worse with palpation patient also complains of nausea patient's approximate [redacted] weeks pregnant with her teeth baby she had one child die early the rest of the other 7 are healthy and doing well patient's only medical problem is in the stones   Past Medical History  Diagnosis Date  . Kidney calculi   . History of lithotripsy     There are no active problems to display for this patient.   History reviewed. No pertinent past surgical history.  Current Outpatient Rx  Name  Route  Sig  Dispense  Refill  . amoxicillin (AMOXIL) 500 MG capsule   Oral   Take 1 capsule (500 mg total) by mouth 2 (two) times daily.   20 capsule   0   . ibuprofen (ADVIL,MOTRIN) 200 MG tablet   Oral   Take 400 mg by mouth every 6 (six) hours as needed for fever or moderate pain.         Marland Kitchen. ibuprofen (ADVIL,MOTRIN) 800 MG tablet   Oral   Take 1 tablet (800 mg total) by mouth every 8 (eight) hours as needed.   15 tablet   0   . ketorolac (TORADOL) 10 MG tablet   Oral   Take 1 tablet (10 mg total) by mouth every 8 (eight) hours as needed.   20 tablet   0   . oxyCODONE-acetaminophen (PERCOCET/ROXICET) 5-325 MG per tablet   Oral   Take 1-2 tablets by mouth every 4 (four) hours as needed for severe pain.   10 tablet   0   . oxyCODONE-acetaminophen (ROXICET) 5-325 MG per tablet   Oral   Take 1 tablet by mouth every 6 (six) hours as needed.   10 tablet   0     Allergies Hydrocodone  No family history on file.  Social History Social  History  Substance Use Topics  . Smoking status: Current Every Day Smoker  . Smokeless tobacco: None  . Alcohol Use: No    Review of Systems Constitutional: No fever/chills Eyes: No visual changes. ENT: No sore throat. Cardiovascular: Denies chest pain. Respiratory: Denies shortness of breath. Gastrointestinalabdominal pain.  No nausea, no vomiting.  No diarrhea.  No constipation. Genitourinary: Negative for dysuria. Musculoskeletal: Negative for back pain. Skin: Negative for rash. Neurological: Negative for headaches, focal weakness or numbness.  10-point ROS otherwise negative.  ____________________________________________   PHYSICAL EXAM:  VITAL SIGNS: ED Triage Vitals  Enc Vitals Group     BP 09/23/15 1002 138/106 mmHg     Pulse Rate 09/23/15 1002 96     Resp 09/23/15 1002 20     Temp 09/23/15 1002 98.4 F (36.9 C)     Temp Source 09/23/15 1002 Oral     SpO2 09/23/15 1002 100 %     Weight 09/23/15 1002 130 lb (58.968 kg)     Height 09/23/15 1002 5\' 5"  (1.651 m)     Head Cir --      Peak Flow --      Pain Score 09/23/15 1002 9     Pain  Loc --      Pain Edu? --      Excl. in GC? --     Constitutional: Alert and oriented. Patient appears to be in some pain Eyes: Conjunctivae are normal. PERRL. EOMI. Head: Atraumatic. Nose: No congestion/rhinnorhea. Mouth/Throat: Mucous membranes are moist.  Oropharynx non-erythematous. Neck: No stridor.   Cardiovascular: Normal rate, regular rhythm. Grossly normal heart sounds.  Good peripheral circulation. Respiratory: Normal respiratory effort.  No retractions. Lungs CTAB. Gastrointestinal: Soft and nontender except for at McBurney's point where palpation percussion are exquisitely tender. No distention. No abdominal bruits. No CVA tenderness. }Musculoskeletal: No lower extremity tenderness nor edema.  No joint effusions. Neurologic:  Normal speech and language. No gross focal neurologic deficits are appreciated. No gait  instability. Skin:  Skin is warm, dry and intact. No rash noted.   ____________________________________________   LABS (all labs ordered are listed, but only abnormal results are displayed)  Labs Reviewed  COMPREHENSIVE METABOLIC PANEL - Abnormal; Notable for the following:    Albumin 3.4 (*)    All other components within normal limits  CBC - Abnormal; Notable for the following:    WBC 11.4 (*)    RDW 14.8 (*)    All other components within normal limits  HCG, QUANTITATIVE, PREGNANCY - Abnormal; Notable for the following:    hCG, Beta Chain, Quant, S 16109 (*)    All other components within normal limits  LIPASE, BLOOD  URINALYSIS COMPLETEWITH MICROSCOPIC ([facility] ONLY)   ____________________________________________  EKG   ____________________________________________  RADIOLOGY   ____________________________________________   PROCEDURES   ____________________________________________   INITIAL IMPRESSION / ASSESSMENT AND PLAN / ED COURSE  Pertinent labs & imaging results that were available during my care of the patient were reviewed by me and considered in my medical decision making (see chart for details).  Ultrasounds do not show any pathology that would explain her pain were waiting for the MRI of the belly I will sign this patient out to [facility] ____________________________________________   FINAL CLINICAL IMPRESSION(S) / ED DIAGNOSES  Final diagnoses:  Abdominal pain, unspecified abdominal location      Arnaldo Natal, MD 09/23/15 1557

## 2015-09-23 NOTE — ED Provider Notes (Signed)
----------------------------------------- 3:57 PM on 09/23/2015 -----------------------------------------   Blood pressure 114/74, pulse 74, temperature 98 F (36.7 C), temperature source Oral, resp. rate 16, height 5\' 5"  (1.651 m), weight 58.968 kg, last menstrual period 05/31/2015, SpO2 100 %.  Assuming care from Dr. Darnelle CatalanMalinda.  In short, Angelica Sampson is a 34 y.o. female with a chief complaint of Abdominal Pain .  Refer to the original H&P for additional details.  The current plan of care is to follow-up on abdominal MRI and reassess.  ----------------------------------------- 9:23 PM on 09/23/2015 -----------------------------------------  Mr Pelvis Wo Contrast  09/23/2015  CLINICAL DATA:  Right lower quadrant pain for 3 days. [redacted] weeks pregnant. EXAM: MRI ABDOMEN AND PELVIS WITHOUT CONTRAST TECHNIQUE: Multiplanar multisequence MR imaging of the abdomen and pelvis was performed. No intravenous contrast was administered. COMPARISON:  None. FINDINGS: MRI ABDOMEN FINDINGS Liver, spleen, pancreas, adrenal glands, and kidneys have a normal appearance on this unenhanced exam. No evidence of hydronephrosis. Gallbladder is unremarkable. No mass or lymphadenopathy identified. No inflammatory process or abnormal fluid collection identified. MRI PELVIS FINDINGS A single intrauterine fetus is seen. Maternal uterus is unremarkable. Right ovary appears normal. No adnexal mass identified. Appendix is not well visualized on this exam, however no focal inflammatory process seen in the area of the cecum. Tiny amount of free fluid in right adnexa is nonspecific. IMPRESSION: Single intrauterine fetus. Although the appendix is not well visualized, there is no definite radiographic evidence of appendicitis or other acute findings. Minimal free fluid in right adnexa is nonspecific. Electronically Signed   By: Myles RosenthalJohn  Stahl M.D.   On: 09/23/2015 21:05   Mr Abdomen Wo Contrast  09/23/2015  CLINICAL DATA:  Right lower  quadrant pain for 3 days. [redacted] weeks pregnant. EXAM: MRI ABDOMEN AND PELVIS WITHOUT CONTRAST TECHNIQUE: Multiplanar multisequence MR imaging of the abdomen and pelvis was performed. No intravenous contrast was administered. COMPARISON:  None. FINDINGS: MRI ABDOMEN FINDINGS Liver, spleen, pancreas, adrenal glands, and kidneys have a normal appearance on this unenhanced exam. No evidence of hydronephrosis. Gallbladder is unremarkable. No mass or lymphadenopathy identified. No inflammatory process or abnormal fluid collection identified. MRI PELVIS FINDINGS A single intrauterine fetus is seen. Maternal uterus is unremarkable. Right ovary appears normal. No adnexal mass identified. Appendix is not well visualized on this exam, however no focal inflammatory process seen in the area of the cecum. Tiny amount of free fluid in right adnexa is nonspecific. IMPRESSION: Single intrauterine fetus. Although the appendix is not well visualized, there is no definite radiographic evidence of appendicitis or other acute findings. Minimal free fluid in right adnexa is nonspecific. Electronically Signed   By: Myles RosenthalJohn  Stahl M.D.   On: 09/23/2015 21:05   [place] Ob Limited  09/23/2015  CLINICAL DATA:  Right lower quadrant pain 2 days. Estimated gestational age per LMP 16 weeks 3 days. EXAM: LIMITED OBSTETRIC ULTRASOUND AN DOPPLER FINDINGS: Number of Fetuses: 1 Heart Rate:  118 bpm Movement: Yes Presentation: Cephalic Placental Location: Anterior Previa: No Amniotic Fluid (Subjective):  Within normal limits. BPD:  2.9cm 15w 2d   EDC: 03/14/2016 MATERNAL FINDINGS: Cervix:  Appears closed.  4.1 cm in length. Uterus/Adnexae: Mild free fluid in the right adnexa. Ovaries otherwise within normal. 1 cm hypoechoic region within the placenta likely prominent venous channel. Pulsed Doppler evaluation of both ovaries demonstrates normal low-resistance arterial and venous waveforms. Other findings No free fluid. IMPRESSION: Single live IUP in cephalic  presentation with estimated gestational age [redacted] weeks 2 days. Mild free fluid  right adnexa. This exam is performed on an emergent basis and does not comprehensively evaluate fetal size, dating, or anatomy; follow-up complete OB US should be considered if further fetal assessment is warranted. Electronically Signed   By: Elberta Fortis M.D.   On: 09/23/2015 14:43   US Ob Transvaginal  09/23/2015  CLINICAL DATA:  Right lower quadrant pain 2 days. Estimated gestational age per LMP 16 weeks 3 days. EXAM: LIMITED OBSTETRIC ULTRASOUND AN DOPPLER FINDINGS: Number of Fetuses: 1 Heart Rate:  118 bpm Movement: Yes Presentation: Cephalic Placental Location: Anterior Previa: No Amniotic Fluid (Subjective):  Within normal limits. BPD:  2.9cm 15w 2d   EDC: 03/14/2016 MATERNAL FINDINGS: Cervix:  Appears closed.  4.1 cm in length. Uterus/Adnexae: Mild free fluid in the right adnexa. Ovaries otherwise within normal. 1 cm hypoechoic region within the placenta likely prominent venous channel. Pulsed Doppler evaluation of both ovaries demonstrates normal low-resistance arterial and venous waveforms. Other findings No free fluid. IMPRESSION: Single live IUP in cephalic presentation with estimated gestational age [redacted] weeks 2 days. Mild free fluid right adnexa. This exam is performed on an emergent basis and does not comprehensively evaluate fetal size, dating, or anatomy; follow-up complete OB US should be considered if further fetal assessment is warranted. Electronically Signed   By: Elberta Fortis M.D.   On: 09/23/2015 14:43   [place] Art/ven Flow Abd Pelv Doppler  09/23/2015  CLINICAL DATA:  Right lower quadrant pain 2 days. Estimated gestational age per LMP 16 weeks 3 days. EXAM: LIMITED OBSTETRIC ULTRASOUND AN DOPPLER FINDINGS: Number of Fetuses: 1 Heart Rate:  118 bpm Movement: Yes Presentation: Cephalic Placental Location: Anterior Previa: No Amniotic Fluid (Subjective):  Within normal limits. BPD:  2.9cm 15w 2d   EDC: 03/14/2016  MATERNAL FINDINGS: Cervix:  Appears closed.  4.1 cm in length. Uterus/Adnexae: Mild free fluid in the right adnexa. Ovaries otherwise within normal. 1 cm hypoechoic region within the placenta likely prominent venous channel. Pulsed Doppler evaluation of both ovaries demonstrates normal low-resistance arterial and venous waveforms. Other findings No free fluid. IMPRESSION: Single live IUP in cephalic presentation with estimated gestational age [redacted] weeks 2 days. Mild free fluid right adnexa. This exam is performed on an emergent basis and does not comprehensively evaluate fetal size, dating, or anatomy; follow-up complete OB US should be considered if further fetal assessment is warranted. Electronically Signed   By: Elberta Fortis M.D.   On: 09/23/2015 14:43    Of note, the is no acute findings on MRI that are concerning.  The patient never provided a urine sample but also stated she did not want to and she is having no dysuria or signs and symptoms of urinary tract infection.  After I told her the results of the MRI she wants to go home immediately so I will discharge her.  I encouraged her to follow up as soon as possible at [facility] with MFM (8th pregnancy) to follow-up. I gave my usual and customary return precautions.     Loleta Rose, MD 09/23/15 2129

## 2015-09-23 NOTE — ED Notes (Addendum)
pt off floor for Ultrasound

## 2015-09-23 NOTE — Discharge Instructions (Signed)
You have been seen in the Emergency Department (ED) for abdominal pain.  Your evaluation did not identify a clear cause of your symptoms but was generally reassuring.  Please follow up as instructed above regarding todays emergent visit and the symptoms that are bothering you.  Take Percocet as prescribed for severe pain. Do not drink alcohol, drive or participate in any other potentially dangerous activities while taking this medication as it may make you sleepy. Do not take this medication with any other sedating medications, either prescription or over-the-counter. If you were prescribed Percocet or Vicodin, do not take these with acetaminophen (Tylenol) as it is already contained within these medications.   This medication is an opiate (or narcotic) pain medication and can be habit forming.  Use it as little as possible to achieve adequate pain control.  Do not use or use it with extreme caution if you have a history of opiate abuse or dependence.  If you are on a pain contract with your primary care doctor or a pain specialist, be sure to let them know you were prescribed this medication today from the [facility].  This medication is intended for your use only - do not give any to anyone else and keep it in a secure place where nobody else, especially children, have access to it.  It will also cause or worsen constipation, so you may want to consider taking an over-the-counter stool softener while you are taking this medication.  Please keep in mind that this medication is not good for your your unborn baby as well.  Use it ONLY IF NECESSARY.  Return to the ED if your abdominal pain worsens or fails to improve, you develop bloody vomiting, bloody diarrhea, you are unable to tolerate fluids due to vomiting, fever greater than 101, or other symptoms that concern you.

## 2015-09-23 NOTE — ED Notes (Signed)
Pt reports that she is approx 16 weeks preg, reports that she has been having rt lower abd pain x 3 days. Denies nvd. Pt also reports she was "spotting" a couple days ago but not currently. Pt does not have ob/gyn currently.

## 2016-04-29 ENCOUNTER — Emergency Department
Admission: EM | Admit: 2016-04-29 | Discharge: 2016-04-29 | Disposition: A | Discharge status: Home / Self Care | Payer: Medicaid Other | Attending: Emergency Medicine | Admitting: Emergency Medicine

## 2016-04-29 ENCOUNTER — Emergency Department: Payer: Medicaid Other

## 2016-04-29 ENCOUNTER — Encounter: Payer: Self-pay | Admitting: Emergency Medicine

## 2016-04-29 DIAGNOSIS — S2220XA Unspecified fracture of sternum, initial encounter for closed fracture: Secondary | ICD-10-CM | POA: Diagnosis not present

## 2016-04-29 DIAGNOSIS — S62322A Displaced fracture of shaft of third metacarpal bone, right hand, initial encounter for closed fracture: Secondary | ICD-10-CM | POA: Diagnosis not present

## 2016-04-29 DIAGNOSIS — Y999 Unspecified external cause status: Secondary | ICD-10-CM | POA: Diagnosis not present

## 2016-04-29 DIAGNOSIS — S6291XB Unspecified fracture of right wrist and hand, initial encounter for open fracture: Secondary | ICD-10-CM

## 2016-04-29 DIAGNOSIS — R0789 Other chest pain: Secondary | ICD-10-CM | POA: Insufficient documentation

## 2016-04-29 DIAGNOSIS — S80812A Abrasion, left lower leg, initial encounter: Secondary | ICD-10-CM | POA: Insufficient documentation

## 2016-04-29 DIAGNOSIS — Y939 Activity, unspecified: Secondary | ICD-10-CM | POA: Insufficient documentation

## 2016-04-29 DIAGNOSIS — F17200 Nicotine dependence, unspecified, uncomplicated: Secondary | ICD-10-CM | POA: Diagnosis not present

## 2016-04-29 DIAGNOSIS — S6991XA Unspecified injury of right wrist, hand and finger(s), initial encounter: Secondary | ICD-10-CM | POA: Diagnosis present

## 2016-04-29 DIAGNOSIS — Y929 Unspecified place or not applicable: Secondary | ICD-10-CM | POA: Insufficient documentation

## 2016-04-29 DIAGNOSIS — T148XXA Other injury of unspecified body region, initial encounter: Secondary | ICD-10-CM

## 2016-04-29 LAB — CREATININE, SERUM
Creatinine, Ser: 1.07 mg/dL — ABNORMAL HIGH (ref 0.44–1.00)
GFR calc Af Amer: >60 mL/min (ref >60)
GFR calc non Af Amer: >60 mL/min (ref >60)

## 2016-04-29 LAB — POCT PREGNANCY, URINE: PREG TEST UR: NEGATIVE

## 2016-04-29 MED ORDER — IOPAMIDOL (ISOVUE-300) INJECTION 61%
75.0000 mL | Freq: Once | INTRAVENOUS | Status: AC | PRN
  Administered 2016-04-29: 75 mL via INTRAVENOUS
  Filled 2016-04-29: qty 75

## 2016-04-29 MED ORDER — MORPHINE SULFATE (PF) 4 MG/ML IV SOLN
4.0000 mg | Freq: Once | INTRAVENOUS | Status: AC
Start: 2016-04-29 — End: 2016-04-29
  Administered 2016-04-29: 4 mg via INTRAVENOUS

## 2016-04-29 MED ORDER — OXYCODONE-ACETAMINOPHEN 5-325 MG PO TABS
ORAL_TABLET | ORAL | Status: DC
Start: 2016-04-29 — End: 2016-04-29
  Filled 2016-04-29: qty 1

## 2016-04-29 MED ORDER — IBUPROFEN 800 MG PO TABS: 800.0000 mg | ORAL_TABLET | Freq: Three times a day (TID) | ORAL | Status: DC | PRN

## 2016-04-29 MED ORDER — OXYCODONE-ACETAMINOPHEN 5-325 MG PO TABS
1.0000 | ORAL_TABLET | Freq: Once | ORAL | Status: AC
  Administered 2016-04-29: 1 via ORAL

## 2016-04-29 MED ORDER — OXYCODONE-ACETAMINOPHEN 5-325 MG PO TABS: 1.0000 | ORAL_TABLET | Freq: Four times a day (QID) | ORAL | Status: DC | PRN

## 2016-04-29 MED ORDER — MORPHINE SULFATE (PF) 4 MG/ML IV SOLN
INTRAVENOUS | Status: AC
  Filled 2016-04-29: qty 1

## 2016-04-29 NOTE — ED Notes (Signed)
Back from ct scan   Family at bedside. 
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
## 2016-04-29 NOTE — ED Notes (Signed)
Pt informed urine needed for POC preg. States not able now but will try again.
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
## 2016-04-29 NOTE — Discharge Instructions (Signed)
Sternal Fracture The sternum is the bone in the center of the front of your chest which your ribs attach to. It is also called the breastbone. The most common cause of a sternal fracture (break in the bone) is an injury. The most common injury is from a motor vehicle accident. The fracture often comes from the seat belt or hitting the chest on the steering wheel or being forcibly bent forward (shoulders toward your knees) during an accident. It is more common in females and the elderly. The fracture of the sternum is usually not a problem if there are no other injuries. Other injuries that may happen are to the ribs, heart, lungs, and abdominal organs. SYMPTOMS  Common complaints from a fracture of the sternum include:  Shortness of breath.  Pain with breathing or difficulty breathing.  Bruises about the chest.  Tenderness or a cracking sound at the breastbone. DIAGNOSIS  Your caregiver may be able to tell if the sternum is broken by examining you. Other times studies such as X-ray, CAT scan, ultrasound, and nuclear medicine are used to detect a fracture.  TREATMENT   Sternal fractures usually are not serious and if displacement is minimal, no treatment is necessary.  The main concern is with damage to the surrounding structures: ribs, heart, great vessels coming from the heart, and the backbone in the chest area.  Multiple rib fractures may cause breathing difficulties.  Injury to one of the large vessels in the chest may be a threat to life and require immediate surgery.  If injury to the heart or lungs is suspected it may be necessary to stay in the hospital and be monitored.  Other injuries will be treated as needed.  If the pieces of the breastbone are out of normal position, they may need to be reduced (put back in position) and then wired in place or fixed with a plate and screws during an operation. HOME CARE INSTRUCTIONS   Avoid strenuous activity. Be careful during activities  and avoid bumping or reinjuring the injured sternum. Activities that cause pain pull on the fracture site(s) and are best avoided if possible.  Eat a normal, well-balanced diet. Drink plenty of fluids to avoid constipation, a common side effect of pain medications.  Take deep breaths and cough several times a day, splinting the injured area with a pillow. This will help prevent pneumonia.  Do not wear a rib belt or binder for the chest unless instructed otherwise. These restrict breathing and can lead to pneumonia.  Only take over-the-counter or prescription medicines for pain, discomfort, or fever as directed by your caregiver. SEEK MEDICAL CARE IF:  You develop a continual cough, associated with thick or bloody mucus or phlegm (sputum). SEEK IMMEDIATE MEDICAL CARE IF:   You have a fever.  You have increasing difficulty breathing.  You feel sick to your stomach (nausea), vomit, or have abdominal pain.  You have worsening pain, not controlled with medications.  You develop pain in the tops of your shoulders (in the shoulder strap area).  You feel light-headed or faint.  You develop chest pain or an abnormal heartbeat (palpitations).  You develop pain radiating into the jaw, teeth or down the arms.   This information is not intended to replace advice given to you by your health care provider. Make sure you discuss any questions you have with your health care provider.   Document Released: 05/28/2004 Document Revised: 11/04/2014 Document Reviewed: 05/10/2015 Elsevier Interactive Patient Education 2016 [employer]    Take pain medicine as directed. Contact the orthopedist on Wednesday, to schedule a follow-up appointment. Return to emergency for any worsening symptoms.

## 2016-05-20 ENCOUNTER — Encounter: Payer: Self-pay | Admitting: Emergency Medicine

## 2016-05-20 ENCOUNTER — Emergency Department
Admission: EM | Admit: 2016-05-20 | Discharge: 2016-05-20 | Disposition: A | Discharge status: Home / Self Care | Payer: Medicaid Other | Attending: Emergency Medicine | Admitting: Emergency Medicine

## 2016-05-20 DIAGNOSIS — K029 Dental caries, unspecified: Secondary | ICD-10-CM | POA: Diagnosis not present

## 2016-05-20 DIAGNOSIS — F17200 Nicotine dependence, unspecified, uncomplicated: Secondary | ICD-10-CM | POA: Diagnosis not present

## 2016-05-20 DIAGNOSIS — Z79899 Other long term (current) drug therapy: Secondary | ICD-10-CM | POA: Insufficient documentation

## 2016-05-20 DIAGNOSIS — K0889 Other specified disorders of teeth and supporting structures: Secondary | ICD-10-CM | POA: Diagnosis present

## 2016-05-20 DIAGNOSIS — K047 Periapical abscess without sinus: Secondary | ICD-10-CM

## 2016-05-20 MED ORDER — AMOXICILLIN 500 MG PO CAPS: 500.0000 mg | ORAL_CAPSULE | Freq: Three times a day (TID) | ORAL | 0 refills | Status: DC

## 2016-05-20 MED ORDER — AMOXICILLIN 500 MG PO CAPS
500.0000 mg | ORAL_CAPSULE | Freq: Once | ORAL | Status: AC
  Administered 2016-05-20: 500 mg via ORAL
  Filled 2016-05-20: qty 1

## 2016-05-20 MED ORDER — IBUPROFEN 600 MG PO TABS
600.0000 mg | ORAL_TABLET | Freq: Once | ORAL | Status: AC
  Administered 2016-05-20: 600 mg via ORAL
  Filled 2016-05-20: qty 1

## 2016-05-20 MED ORDER — LIDOCAINE VISCOUS 2 % MT SOLN
15.0000 mL | Freq: Once | OROMUCOSAL | Status: AC
  Administered 2016-05-20: 15 mL via OROMUCOSAL
  Filled 2016-05-20: qty 15

## 2016-05-20 MED ORDER — IBUPROFEN 600 MG PO TABS: 600.0000 mg | ORAL_TABLET | Freq: Four times a day (QID) | ORAL | 0 refills | Status: DC | PRN

## 2016-05-20 MED ORDER — OXYCODONE-ACETAMINOPHEN 5-325 MG PO TABS
1.0000 | ORAL_TABLET | Freq: Once | ORAL | Status: AC
  Administered 2016-05-20: 1 via ORAL
  Filled 2016-05-20: qty 1

## 2016-05-20 MED ORDER — OXYCODONE-ACETAMINOPHEN 7.5-325 MG PO TABS: 1.0000 | ORAL_TABLET | ORAL | 0 refills | Status: DC | PRN

## 2016-05-20 NOTE — ED Triage Notes (Signed)
Patient ambulatory to triage with steady gait, without difficulty or distress noted, talking on phone, drinking soda; swelling to right lower jaw; st present since last night

## 2016-05-20 NOTE — ED Provider Notes (Signed)
[facility] Emergency Department Provider Note   ____________________________________________  Time seen: Approximately 11:01 PM  I have reviewed the triage vital signs and the nursing notes.   HISTORY  Chief Complaint Dental Pain    HPI Angelica Sampson is a 35 y.o. female complaining of edema erythema and pain to the right lower jaw. Onset of complaint was last night. Patient has a history of devitalized teeth. Patient denies any fever associated this complaint. Patient rates the pain as a 10 over 10. Patient described the pain as sharp. No palliative measures for this complaint.   Past Medical History:  Diagnosis Date  . History of lithotripsy   . Kidney calculi     There are no active problems to display for this patient.   History reviewed. No pertinent surgical history.  Current Outpatient Rx  . Order #: [id number] Class: Print  . Order #: [id number] Class: Print  . Order #: [id number] Class: Print  . Order #: [id number] Class: Print  . Order #: [id number] Class: Print  . Order #: [id number] Class: Print  . Order #: [id number] Class: Print    Allergies Hydrocodone  No family history on file.  Social History Social History  Substance Use Topics  . Smoking status: Current Every Day Smoker  . Smokeless tobacco: Never Used  . Alcohol use No    Review of Systems Constitutional: No fever/chills Eyes: No visual changes. ENT: No sore throat.Dental pain Cardiovascular: Denies chest pain. Respiratory: Denies shortness of breath. Gastrointestinal: No abdominal pain.  No nausea, no vomiting.  No diarrhea.  No constipation. Genitourinary: Negative for dysuria. Musculoskeletal: Negative for back pain. Skin: Negative for rash. Neurological: Negative for headaches, focal weakness or numbness.    ____________________________________________   PHYSICAL EXAM:  VITAL SIGNS: ED Triage Vitals [05/20/16 2133]  Enc Vitals Group     BP 122/84   Pulse Rate 97     Resp 18     Temp 98.1 F (36.7 C)     Temp Source Oral     SpO2 100 %     Weight 123 lb (55.8 kg)     Height 5\' 5"  (1.651 m)     Head Circumference      Peak Flow      Pain Score 10     Pain Loc      Pain Edu?      Excl. in GC?     Constitutional: Alert and oriented. Well appearing and in no acute distress. Eyes: Conjunctivae are normal. PERRL. EOMI. Head: Atraumatic. Nose: No congestion/rhinnorhea. Mouth/Throat: Mucous membranes are moist. Devitalized  molar is #28 and 29 with edematous and erythematous gingiva. Neck: No stridor.  No cervical spine tenderness to palpation. Hematological/Lymphatic/Immunilogical: No cervical lymphadenopathy. Cardiovascular: Normal rate, regular rhythm. Grossly normal heart sounds.  Good peripheral circulation. Respiratory: Normal respiratory effort.  No retractions. Lungs CTAB. Gastrointestinal: Soft and nontender. No distention. No abdominal bruits. No CVA tenderness. Musculoskeletal: No lower extremity tenderness nor edema.  No joint effusions. Neurologic:  Normal speech and language. No gross focal neurologic deficits are appreciated. No gait instability. Skin:  Skin is warm, dry and intact. No rash noted. Psychiatric: Mood and affect are normal. Speech and behavior are normal.  ____________________________________________   LABS (all labs ordered are listed, but only abnormal results are displayed)  Labs Reviewed - No data to display ____________________________________________  EKG   ____________________________________________  RADIOLOGY   ____________________________________________   PROCEDURES  Procedure(s) performed: None  Procedures  Critical Care performed: No  ____________________________________________   INITIAL IMPRESSION / ASSESSMENT AND PLAN / ED COURSE  Pertinent labs & imaging results that were available during my care of the patient were reviewed by me and considered in my medical  decision making (see chart for details).  Dental abscess between tooth #28 and 29. Patient given discharge Instructions. Patient advised to follow-up with [facility] tomorrow morning. Patient given a prescription for amoxicillin, tramadol, and ibuprofen.  Clinical Course     ____________________________________________   FINAL CLINICAL IMPRESSION(S) / ED DIAGNOSES  Final diagnoses:  Infected dental caries      NEW MEDICATIONS STARTED DURING THIS VISIT:  New Prescriptions   AMOXICILLIN (AMOXIL) 500 MG CAPSULE    Take 1 capsule (500 mg total) by mouth 3 (three) times daily.   IBUPROFEN (ADVIL,MOTRIN) 600 MG TABLET    Take 1 tablet (600 mg total) by mouth every 6 (six) hours as needed.   OXYCODONE-ACETAMINOPHEN (PERCOCET) 7.5-325 MG TABLET    Take 1 tablet by mouth every 4 (four) hours as needed for severe pain.     Note:  This document was prepared using Dragon voice recognition software and may include unintentional dictation errors.    Joni Reining, PA-C 05/20/16 2316    Rockne Menghini, MD 05/21/16 [phone]

## 2016-11-26 ENCOUNTER — Emergency Department
Admission: EM | Admit: 2016-11-26 | Discharge: 2016-11-26 | Disposition: A | Discharge status: Home / Self Care | Payer: Medicaid Other | Attending: Emergency Medicine | Admitting: Emergency Medicine

## 2016-11-26 ENCOUNTER — Encounter: Payer: Self-pay | Admitting: Emergency Medicine

## 2016-11-26 DIAGNOSIS — K0889 Other specified disorders of teeth and supporting structures: Secondary | ICD-10-CM

## 2016-11-26 DIAGNOSIS — F17200 Nicotine dependence, unspecified, uncomplicated: Secondary | ICD-10-CM | POA: Diagnosis not present

## 2016-11-26 DIAGNOSIS — K047 Periapical abscess without sinus: Secondary | ICD-10-CM | POA: Insufficient documentation

## 2016-11-26 MED ORDER — DEXAMETHASONE SODIUM PHOSPHATE 10 MG/ML IJ SOLN
10.0000 mg | Freq: Once | INTRAMUSCULAR | Status: AC
  Administered 2016-11-26: 10 mg via INTRAMUSCULAR
  Filled 2016-11-26: qty 1

## 2016-11-26 MED ORDER — AMOXICILLIN 500 MG PO TABS: 500.0000 mg | ORAL_TABLET | Freq: Three times a day (TID) | ORAL | 0 refills | Status: DC

## 2016-11-26 MED ORDER — DEXAMETHASONE 4 MG PO TABS: ORAL_TABLET | ORAL | 0 refills | Status: DC

## 2016-11-26 MED ORDER — IBUPROFEN 800 MG PO TABS: 800.0000 mg | ORAL_TABLET | Freq: Three times a day (TID) | ORAL | 0 refills | Status: DC | PRN

## 2016-11-26 MED ORDER — TRAMADOL HCL 50 MG PO TABS: 50.0000 mg | ORAL_TABLET | Freq: Four times a day (QID) | ORAL | 0 refills | Status: DC | PRN

## 2016-11-26 MED ORDER — LIDOCAINE VISCOUS 2 % MT SOLN: 10.0000 mL | OROMUCOSAL | 0 refills | Status: DC | PRN

## 2016-11-26 NOTE — ED Provider Notes (Signed)
[facility] Emergency Department Provider Note  ____________________________________________  Time seen: Approximately 9:10 AM  I have reviewed the triage vital signs and the nursing notes.   HISTORY  Chief Complaint Dental Pain    HPI TABRIA STEINES is a 36 y.o. female , NAD, presents to the emergency department for evaluation of dental infection. States she has long-term, chronic issues with dental caries and infections. Was seen in this emergency department approximately 6 months ago for the same and patient states this infection is on the opposite side. Reports that medications given to her at the last visit to work very well and cleared her swelling and infection. States she has called a dentist to establish care but has not made an appointment at this time. Denies any injuries or traumas to the face or neck. Has had no fevers, chills or body aches. Has had some mild swelling about the left cheek but denies any swelling about the tongue or throat. Denies any difficulty breathing or swallowing. Has had no chest pain, shortness breath, abdominal pain, nausea or vomiting. Has not noted any redness or abnormal warmth to her skin.   Past Medical History:  Diagnosis Date  . History of lithotripsy   . Kidney calculi     There are no active problems to display for this patient.   History reviewed. No pertinent surgical history.  Prior to Admission medications   Medication Sig Start Date End Date Taking? Authorizing Provider  amoxicillin (AMOXIL) 500 MG tablet Take 1 tablet (500 mg total) by mouth 3 (three) times daily with meals. 11/26/16   Zona Pedro L Jessly Lebeck, PA-C  dexamethasone (DECADRON) 4 MG tablet Take 6 tablets on Day 1 with food, then decrease by 1 tablet daily until finished (6,5,4,3,2,1) 11/26/16   Yazaira Speas L Breshay Ilg, PA-C  ibuprofen (ADVIL,MOTRIN) 800 MG tablet Take 1 tablet (800 mg total) by mouth every 8 (eight) hours as needed (pain). 11/26/16   Connee Ikner L Starleen Trussell,  PA-C  lidocaine (XYLOCAINE) 2 % solution Use as directed 10 mLs in the mouth or throat every 4 (four) hours as needed for mouth pain. 11/26/16   Teniya Filter L Cleora Karnik, PA-C  ondansetron (ZOFRAN) 4 MG tablet Take 1-2 tabs by mouth every 8 hours as needed for nausea/vomiting 09/23/15   Loleta Rose, MD  traMADol (ULTRAM) 50 MG tablet Take 1 tablet (50 mg total) by mouth every 6 (six) hours as needed. 11/26/16   Rachael Zapanta L Rosalita Carey, PA-C    Allergies Hydrocodone  No family history on file.  Social History Social History  Substance Use Topics  . Smoking status: Current Every Day Smoker  . Smokeless tobacco: Never Used  . Alcohol use No     Review of Systems  Constitutional: No fever/chills ENT: Positive left-sided dental pain, left cheek swelling No Swelling about the lips/tongue/throat. No difficulty swallowing. Cardiovascular: No chest pain. Respiratory: No shortness of breath. No wheezing.  Gastrointestinal: No abdominal pain.  No nausea, vomiting.   Musculoskeletal: Negative for neck pain.  Skin: Negative for redness, abnormal warmth. Neurological: Negative for headaches. 10-point ROS otherwise negative.  ____________________________________________   PHYSICAL EXAM:  VITAL SIGNS: ED Triage Vitals  Enc Vitals Group     BP 11/26/16 0849 (!) 120/91     Pulse Rate 11/26/16 0849 85     Resp 11/26/16 0849 18     Temp 11/26/16 0849 97.9 F (36.6 C)     Temp Source 11/26/16 0849 Oral     SpO2 11/26/16 0849 100 %  Weight 11/26/16 0846 123 lb (55.8 kg)     Height 11/26/16 0849 5\' 5"  (1.651 m)     Head Circumference --      Peak Flow --      Pain Score 11/26/16 0846 10     Pain Loc --      Pain Edu? --      Excl. in GC? --      Constitutional: Alert and oriented. Well appearing and in no acute distress. Eyes: Conjunctivae are normal.  Head: Atraumatic. ENT:      Nose: No congestion/rhinnorhea.      Mouth/Throat: Moderate swelling noted about the left cheek without induration or  fluctuance. Diffuse poor dentition. Mild swelling noted about the left upper, posterior gumline at the site of a molar with significant cavities. Mucous membranes are moist. No swelling about the lips or tongue. Uvula is midline. Airway is patent. Neck: No stridor. Supple with full range of motion. Hematological/Lymphatic/Immunilogical: No cervical lymphadenopathy. Cardiovascular:  Good peripheral circulation. Respiratory: Normal respiratory effort without tachypnea or retractions.  Musculoskeletal: No tenderness about the jawline nor TMJ. No popping or locking with opening and closing of the mouth at the TMJ. Neurologic:  Normal speech and language. No gross focal neurologic deficits are appreciated.  Skin:  Skin is warm, dry and intact. No rash, redness, abnormal warmth noted. Psychiatric: Mood and affect are normal. Speech and behavior are normal. Patient exhibits appropriate insight and judgement.   ____________________________________________   LABS  None ____________________________________________  EKG  None ____________________________________________  RADIOLOGY  None ____________________________________________    PROCEDURES  Procedure(s) performed: None   Procedures   Medications  dexamethasone (DECADRON) injection 10 mg (10 mg Intramuscular Given 11/26/16 0935)     ____________________________________________   INITIAL IMPRESSION / ASSESSMENT AND PLAN / ED COURSE  Pertinent labs & imaging results that were available during my care of the patient were reviewed by me and considered in my medical decision making (see chart for details).     Patient's diagnosis is consistent with Dental abscess causing dental pain. Patient was given IM Decadron while in the emergency department and tolerated well. Patient will be discharged home with prescriptions for amoxicillin, Decadron dose pack, ibuprofen, lidocaine viscous and Ultram to take as directed. Patient is to  follow up with her dentist if symptoms persist past this treatment course. Patient is given ED precautions to return to the ED for any worsening or new symptoms.    ____________________________________________  FINAL CLINICAL IMPRESSION(S) / ED DIAGNOSES  Final diagnoses:  Dental abscess  Dentalgia      NEW MEDICATIONS STARTED DURING THIS VISIT:  New Prescriptions   AMOXICILLIN (AMOXIL) 500 MG TABLET    Take 1 tablet (500 mg total) by mouth 3 (three) times daily with meals.   DEXAMETHASONE (DECADRON) 4 MG TABLET    Take 6 tablets on Day 1 with food, then decrease by 1 tablet daily until finished (6,5,4,3,2,1)   IBUPROFEN (ADVIL,MOTRIN) 800 MG TABLET    Take 1 tablet (800 mg total) by mouth every 8 (eight) hours as needed (pain).   LIDOCAINE (XYLOCAINE) 2 % SOLUTION    Use as directed 10 mLs in the mouth or throat every 4 (four) hours as needed for mouth pain.   TRAMADOL (ULTRAM) 50 MG TABLET    Take 1 tablet (50 mg total) by mouth every 6 (six) hours as needed.         Hope Pigeon, PA-C 11/26/16 1005    Christiane Ha  Donnella BiE Williams, MD 11/26/16 1130

## 2016-11-26 NOTE — ED Triage Notes (Signed)
PT WITH LEFT SIDED FACIAL SWELLING DUE TO DENTAL ABSCESS

## 2017-02-03 ENCOUNTER — Emergency Department: Payer: Medicaid Other

## 2017-02-03 ENCOUNTER — Encounter: Payer: Self-pay | Admitting: *Deleted

## 2017-02-03 ENCOUNTER — Emergency Department
Admission: EM | Admit: 2017-02-03 | Discharge: 2017-02-03 | Disposition: A | Discharge status: Home / Self Care | Payer: Medicaid Other | Attending: Emergency Medicine | Admitting: Emergency Medicine

## 2017-02-03 DIAGNOSIS — Y92410 Unspecified street and highway as the place of occurrence of the external cause: Secondary | ICD-10-CM | POA: Insufficient documentation

## 2017-02-03 DIAGNOSIS — F17200 Nicotine dependence, unspecified, uncomplicated: Secondary | ICD-10-CM | POA: Insufficient documentation

## 2017-02-03 DIAGNOSIS — Y9301 Activity, walking, marching and hiking: Secondary | ICD-10-CM | POA: Diagnosis not present

## 2017-02-03 DIAGNOSIS — Y999 Unspecified external cause status: Secondary | ICD-10-CM | POA: Insufficient documentation

## 2017-02-03 DIAGNOSIS — S8992XA Unspecified injury of left lower leg, initial encounter: Secondary | ICD-10-CM | POA: Diagnosis present

## 2017-02-03 DIAGNOSIS — M79605 Pain in left leg: Secondary | ICD-10-CM

## 2017-02-03 DIAGNOSIS — M79662 Pain in left lower leg: Secondary | ICD-10-CM | POA: Diagnosis not present

## 2017-02-03 DIAGNOSIS — Z79899 Other long term (current) drug therapy: Secondary | ICD-10-CM | POA: Diagnosis not present

## 2017-02-03 MED ORDER — MELOXICAM 15 MG PO TABS: 15.0000 mg | ORAL_TABLET | Freq: Every day | ORAL | 0 refills | Status: DC

## 2017-02-03 MED ORDER — MELOXICAM 7.5 MG PO TABS
15.0000 mg | ORAL_TABLET | Freq: Once | ORAL | Status: AC
Start: 2017-02-03 — End: 2017-02-03
  Administered 2017-02-03: 15 mg via ORAL
  Filled 2017-02-03: qty 2
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
## 2017-02-03 NOTE — ED Notes (Signed)
NP at the bedside for pt evaluation   

## 2017-02-03 NOTE — Discharge Instructions (Signed)
Follow up with the primary care provider of your choice for symptoms that are not improving over the next few days.

## 2018-04-19 ENCOUNTER — Emergency Department
Admission: EM | Admit: 2018-04-19 | Discharge: 2018-04-19 | Attending: Emergency Medicine | Admitting: Emergency Medicine

## 2018-04-19 ENCOUNTER — Emergency Department

## 2018-04-19 ENCOUNTER — Encounter: Payer: Self-pay | Admitting: Emergency Medicine

## 2018-04-19 ENCOUNTER — Other Ambulatory Visit: Payer: Self-pay

## 2018-04-19 DIAGNOSIS — Z79899 Other long term (current) drug therapy: Secondary | ICD-10-CM | POA: Insufficient documentation

## 2018-04-19 DIAGNOSIS — Z765 Malingerer [conscious simulation]: Secondary | ICD-10-CM

## 2018-04-19 DIAGNOSIS — R1032 Left lower quadrant pain: Secondary | ICD-10-CM | POA: Diagnosis not present

## 2018-04-19 DIAGNOSIS — R569 Unspecified convulsions: Secondary | ICD-10-CM

## 2018-04-19 DIAGNOSIS — F17200 Nicotine dependence, unspecified, uncomplicated: Secondary | ICD-10-CM | POA: Diagnosis not present

## 2018-04-19 DIAGNOSIS — K529 Noninfective gastroenteritis and colitis, unspecified: Secondary | ICD-10-CM | POA: Diagnosis not present

## 2018-04-19 LAB — COMPREHENSIVE METABOLIC PANEL
ALT: 17 U/L (ref 14–54)
AST: 25 U/L (ref 15–41)
Albumin: 4.6 g/dL (ref 3.5–5.0)
Alkaline Phosphatase: 70 U/L (ref 38–126)
Anion gap: 10 (ref 5–15)
BILIRUBIN TOTAL: 0.6 mg/dL (ref 0.3–1.2)
BUN: 19 mg/dL (ref 6–20)
CALCIUM: 9.5 mg/dL (ref 8.9–10.3)
CO2: 23 mmol/L (ref 22–32)
CREATININE: 0.66 mg/dL (ref 0.44–1.00)
Chloride: 104 mmol/L (ref 101–111)
Glucose, Bld: 109 mg/dL — ABNORMAL HIGH (ref 65–99)
Potassium: 4 mmol/L (ref 3.5–5.1)
Sodium: 137 mmol/L (ref 135–145)
TOTAL PROTEIN: 8.1 g/dL (ref 6.5–8.1)

## 2018-04-19 LAB — CBC
HCT: 47.5 % — ABNORMAL HIGH (ref 35.0–47.0)
Hemoglobin: 16.5 g/dL — ABNORMAL HIGH (ref 12.0–16.0)
MCH: 30.3 pg (ref 26.0–34.0)
MCHC: 34.7 g/dL (ref 32.0–36.0)
MCV: 87.5 fL (ref 80.0–100.0)
PLATELETS: 341 10*3/uL (ref 150–440)
RBC: 5.43 MIL/uL — AB (ref 3.80–5.20)
RDW: 13.4 % (ref 11.5–14.5)
WBC: 10.6 10*3/uL (ref 3.6–11.0)

## 2018-04-19 LAB — URINE DRUG SCREEN, QUALITATIVE (ARMC ONLY)
AMPHETAMINES, UR SCREEN: NOT DETECTED
BENZODIAZEPINE, UR SCRN: NOT DETECTED
Cannabinoid 50 Ng, Ur ~~LOC~~: NOT DETECTED
Cocaine Metabolite,Ur ~~LOC~~: NOT DETECTED
MDMA (Ecstasy)Ur Screen: NOT DETECTED
Methadone Scn, Ur: NOT DETECTED
Opiate, Ur Screen: NOT DETECTED
PHENCYCLIDINE (PCP) UR S: NOT DETECTED
Tricyclic, Ur Screen: NOT DETECTED

## 2018-04-19 LAB — HCG, QUANTITATIVE, PREGNANCY

## 2018-04-19 LAB — POCT PREGNANCY, URINE: PREG TEST UR: NEGATIVE

## 2018-04-19 LAB — SALICYLATE LEVEL

## 2018-04-19 LAB — ACETAMINOPHEN LEVEL: Acetaminophen (Tylenol), Serum: <10 ug/mL — ABNORMAL LOW (ref 10–30)

## 2018-04-19 LAB — LACTIC ACID, PLASMA: Lactic Acid, Venous: 1.9 mmol/L (ref 0.5–1.9)

## 2018-04-19 LAB — ETHANOL: Alcohol, Ethyl (B): <10 mg/dL (ref <10)

## 2018-04-19 MED ORDER — IOPAMIDOL (ISOVUE-300) INJECTION 61%
80.0000 mL | Freq: Once | INTRAVENOUS | Status: AC | PRN
  Administered 2018-04-19: 80 mL via INTRAVENOUS
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
## 2018-04-19 NOTE — ED Notes (Signed)
MD to bedside to speak with patient at this time.
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
## 2018-04-19 NOTE — ED Provider Notes (Signed)
[facility] Emergency Department Provider Note  ____________________________________________  Time seen: Approximately 4:21 PM  I have reviewed the triage vital signs and the nursing notes.   HISTORY  Chief Complaint Seizures   HPI Angelica Sampson is a 37 y.o. female with a history of cleft palate, kidney stone,  opioid abuse who presents from jail for seizure.  According to the police officer patient was in the shower when some of the other inmates witnessed a seizure-like episode.  Patient was dried and dressed and placed in a wheelchair.  She then had another episode where she became very stiff and it was shaking.  After that patient again was unresponsive.  In route to the emergency room patient was unresponsive to ammonia or sternal rub.  Patient denies any prior history of seizure.  No trauma.  She reports abdominal pain for the last few days located in the left lower quadrant, dull, constant, mild.  No vaginal discharge, no vaginal bleeding, no dysuria, no hematuria, no nausea, no vomiting, no diarrhea, no constipation, no fever or chills.  Patient has had C-sections in the past but no other abdominal surgeries.  She denies ever having similar pain.  Patient denies headache.  She denies any ingestions.  She reports that she is supposed to be on Suboxone however has not taken a dose for 7 days.  She denies any ingestions at the jail.  She denies any history of alcohol abuse.  Past Medical History:  Diagnosis Date  . History of lithotripsy   . Kidney calculi      Prior to Admission medications   Medication Sig Start Date End Date Taking? Authorizing Provider  SUBOXONE 8-2 MG FILM Place 1 Film under the tongue 2 (two) times daily. 03/24/18  Yes [provider]  dexamethasone (DECADRON) 4 MG tablet Take 6 tablets on Day 1 with food, then decrease by 1 tablet daily until finished (6,5,4,3,2,1) Patient not taking: Reported on 04/19/2018 11/26/16   Hagler,  Jami L, PA-C  ibuprofen (ADVIL,MOTRIN) 800 MG tablet Take 1 tablet (800 mg total) by mouth every 8 (eight) hours as needed (pain). Patient not taking: Reported on 04/19/2018 11/26/16   Hagler, Jami L, PA-C  lidocaine (XYLOCAINE) 2 % solution Use as directed 10 mLs in the mouth or throat every 4 (four) hours as needed for mouth pain. Patient not taking: Reported on 04/19/2018 11/26/16   Hagler, Jami L, PA-C  meloxicam (MOBIC) 15 MG tablet Take 1 tablet (15 mg total) by mouth daily. Patient not taking: Reported on 04/19/2018 02/03/17   Kem Boroughs B, FNP  ondansetron (ZOFRAN) 4 MG tablet Take 1-2 tabs by mouth every 8 hours as needed for nausea/vomiting Patient not taking: Reported on 04/19/2018 09/23/15   Loleta Rose, MD  traMADol (ULTRAM) 50 MG tablet Take 1 tablet (50 mg total) by mouth every 6 (six) hours as needed. Patient not taking: Reported on 04/19/2018 11/26/16   Hagler, Jami L, PA-C    Allergies Hydrocodone  History reviewed. No pertinent family history.  Social History Social History   Tobacco Use  . Smoking status: Current Every Day Smoker  . Smokeless tobacco: Never Used  Substance Use Topics  . Alcohol use: No  . Drug use: No    Review of Systems  Constitutional: Negative for fever. Eyes: Negative for visual changes. ENT: Negative for sore throat. Neck: No neck pain  Cardiovascular: Negative for chest pain. Respiratory: Negative for shortness of breath. Gastrointestinal: + LLQ abdominal pain.  No vomiting or diarrhea. Genitourinary: Negative for dysuria. Musculoskeletal: Negative for back pain. Skin: Negative for rash. Neurological: Negative for headaches, weakness or numbness. + seizure Psych: No SI or HI  ____________________________________________   PHYSICAL EXAM:  VITAL SIGNS: Vitals:   04/19/18 1744 04/19/18 1830  BP: 121/76 117/86  Pulse: 92 81  Resp: (!) 26 (!) 22  Temp:    SpO2: 100% 99%   Constitutional: Alert and oriented. Well appearing and  in no apparent distress. HEENT:      Head: Normocephalic and atraumatic.         Eyes: Conjunctivae are normal. Sclera is non-icteric.       Mouth/Throat: Mucous membranes are moist.       Neck: Supple with no signs of meningismus. Cardiovascular: Regular rate and rhythm. No murmurs, gallops, or rubs. 2+ symmetrical distal pulses are present in all extremities. No JVD. Respiratory: Normal respiratory effort. Lungs are clear to auscultation bilaterally. No wheezes, crackles, or rhonchi.  Gastrointestinal: Soft, mild LLQ ttp, and non distended with positive bowel sounds. No rebound or guarding. Genitourinary: No CVA tenderness. Musculoskeletal: Nontender with normal range of motion in all extremities. No edema, cyanosis, or erythema of extremities. Neurologic: Normal speech and language. A & O x3, PERRL, EOMI, no nystagmus, CN II-XII intact, motor testing reveals good tone and bulk throughout. There is no evidence of pronator drift or dysmetria. Muscle strength is 5/5 throughout. Sensory examination is intact. Gait deferred Skin: Skin is warm, dry and intact. No rash noted. Psychiatric: Mood and affect are normal. Speech and behavior are normal.  ____________________________________________   LABS (all labs ordered are listed, but only abnormal results are displayed)  Labs Reviewed  CBC - Abnormal; Notable for the following components:      Result Value   RBC 5.43 (*)    Hemoglobin 16.5 (*)    HCT 47.5 (*)    All other components within normal limits  COMPREHENSIVE METABOLIC PANEL - Abnormal; Notable for the following components:   Glucose, Bld 109 (*)    All other components within normal limits  ACETAMINOPHEN LEVEL - Abnormal; Notable for the following components:   Acetaminophen (Tylenol), Serum <10 (*)    All other components within normal limits  URINE DRUG SCREEN, QUALITATIVE ([facility] ONLY) - Abnormal; Notable for the following components:   Barbiturates, Ur Screen   (*)    Value:  Result not available. Reagent lot number recalled by manufacturer.   All other components within normal limits  HCG, QUANTITATIVE, PREGNANCY  SALICYLATE LEVEL  LACTIC ACID, PLASMA  ETHANOL  POCT PREGNANCY, URINE   ____________________________________________  EKG  ED ECG REPORT I, Nita Sickle, the attending physician, personally viewed and interpreted this ECG.  Normal sinus rhythm, normal intervals, normal axis, no STE or depressions, no evidence of HOCM, AV block, delta wave, ARVD, prolonged QTc, WPW, or Brugada.  ____________________________________________  RADIOLOGY  I have personally reviewed the images performed during this visit and I agree with the Radiologist's read.   Interpretation by Radiologist:  Ct Head Wo Contrast  Result Date: 04/19/2018 CLINICAL DATA:  Patient attempted disc a from police custody most of due. Presents with neck pain. Also headache prior to seizure activity. EXAM: CT HEAD WITHOUT CONTRAST; CT CERVICAL SPINE WITHOUT CONTRAST TECHNIQUE: Contiguous axial images were obtained from the base of the skull through the vertex without intravenous contrast. COMPARISON:  03/30/2015 and 01/16/2014 FINDINGS: CT HEAD FINDINGS BRAIN: The ventricles and sulci are normal. No intraparenchymal hemorrhage, mass effect  nor midline shift. No acute large vascular territory infarcts. No abnormal extra-axial fluid collections. Basal cisterns are midline and not effaced. No acute cerebellar abnormality. VASCULAR: Unremarkable. SKULL/SOFT TISSUES: No skull fracture. No significant soft tissue swelling. ORBITS/SINUSES: The included ocular globes and orbital contents are normal.The mastoid air-cells and included paranasal sinuses are well-aerated. OTHER: None. CT CERVICAL SPINE FINDINGS ALIGNMENT: Slight reversal cervical lordosis which may be secondary to patient positioning or muscle spasm. SKULL BASE AND VERTEBRAE: Cervical vertebral bodies and posterior elements are intact.  Intervertebral disc heights preserved. Minimal dorsal osteophytes from C5 through C7 without significant foraminal encroachment. No destructive bony lesions. C1-2 articulation maintained. SOFT TISSUES AND SPINAL CANAL: No prevertebral soft tissue swelling. No visible canal hematoma. DISC LEVELS: No significant osseous canal stenosis or neural foraminal narrowing. UPPER CHEST: Mild scarring at the left lung apex. OTHER: None. IMPRESSION: No acute intracranial or cervical spine abnormality. Electronically Signed   By: Tollie Ethavid  Kwon M.D.   On: 04/19/2018 18:15   Ct Cervical Spine Wo Contrast  Result Date: 04/19/2018 CLINICAL DATA:  Patient attempted disc a from police custody most of due. Presents with neck pain. Also headache prior to seizure activity. EXAM: CT HEAD WITHOUT CONTRAST; CT CERVICAL SPINE WITHOUT CONTRAST TECHNIQUE: Contiguous axial images were obtained from the base of the skull through the vertex without intravenous contrast. COMPARISON:  03/30/2015 and 01/16/2014 FINDINGS: CT HEAD FINDINGS BRAIN: The ventricles and sulci are normal. No intraparenchymal hemorrhage, mass effect nor midline shift. No acute large vascular territory infarcts. No abnormal extra-axial fluid collections. Basal cisterns are midline and not effaced. No acute cerebellar abnormality. VASCULAR: Unremarkable. SKULL/SOFT TISSUES: No skull fracture. No significant soft tissue swelling. ORBITS/SINUSES: The included ocular globes and orbital contents are normal.The mastoid air-cells and included paranasal sinuses are well-aerated. OTHER: None. CT CERVICAL SPINE FINDINGS ALIGNMENT: Slight reversal cervical lordosis which may be secondary to patient positioning or muscle spasm. SKULL BASE AND VERTEBRAE: Cervical vertebral bodies and posterior elements are intact. Intervertebral disc heights preserved. Minimal dorsal osteophytes from C5 through C7 without significant foraminal encroachment. No destructive bony lesions. C1-2 articulation  maintained. SOFT TISSUES AND SPINAL CANAL: No prevertebral soft tissue swelling. No visible canal hematoma. DISC LEVELS: No significant osseous canal stenosis or neural foraminal narrowing. UPPER CHEST: Mild scarring at the left lung apex. OTHER: None. IMPRESSION: No acute intracranial or cervical spine abnormality. Electronically Signed   By: Tollie Ethavid  Kwon M.D.   On: 04/19/2018 18:15   Ct Abdomen Pelvis W Contrast  Result Date: 04/19/2018 CLINICAL DATA:  Abdominal pain while in jail today prior to seizure activity. EXAM: CT ABDOMEN AND PELVIS WITH CONTRAST TECHNIQUE: Multidetector CT imaging of the abdomen and pelvis was performed using the standard protocol following bolus administration of intravenous contrast. CONTRAST:  80mL ISOVUE-300 IOPAMIDOL (ISOVUE-300) INJECTION 61% COMPARISON:  12/13/2010 FINDINGS: Lower chest: Normal size heart. No pericardial effusion. Clear lung bases. Hepatobiliary: No focal liver abnormality is seen. No gallstones, gallbladder wall thickening, or biliary dilatation. Pancreas: Unremarkable. No pancreatic ductal dilatation or surrounding inflammatory changes. Spleen: Normal in size without focal abnormality. Adrenals/Urinary Tract: Adrenal glands are unremarkable. Kidneys are normal, without renal calculi, focal lesion, or hydronephrosis. Bladder is unremarkable. Stomach/Bowel: Physiologic distention of the stomach. Mild fluid-filled distention of small bowel without mechanical source of bowel obstruction identified. Findings may represent a mild enteritis. Colon is unremarkable. What appears to be appendix is normal. Vascular/Lymphatic: No significant vascular findings are present. No enlarged abdominal or pelvic lymph nodes. Reproductive: Anteverted uterus with  IUD in place. No perforation of the myometrium is apparent. Adnexa are within normal limits. Other: No free air nor free fluid. Musculoskeletal: No acute or significant osseous findings. IMPRESSION: Slight diffuse small  bowel distention with fluid may represent a mild enteritis. Otherwise negative exam. Electronically Signed   By: Tollie Eth M.D.   On: 04/19/2018 18:18     ____________________________________________   PROCEDURES  Procedure(s) performed: None Procedures Critical Care performed:  None ____________________________________________   INITIAL IMPRESSION / ASSESSMENT AND PLAN / ED COURSE  37 y.o. female with a history of cleft palate, kidney stone,  opioid abuse who presents from jail for seizure.  Upon arrival to the emergency room patient seemed postictal and was unresponsive to name calling.  Sternal rub was done which led to patient opening her eyes and immediately started talking to [place].  Patient did not seem confused.  She has had no urinary or bowel loss and no tongue trauma.  There is no traumatic injuries on history and exam.  Patient tells me that she was able to hear the cops talking about putting the ammonia on her nose but she was not able to respond.  She denies any ingestions or any prior history of seizures.  She is completely neurologically intact at this time.  Her abdomen is mildly tender on the left lower quadrant with no rebound or guarding.  At this time differential diagnosis including seizure versus pseudoseizure versus malingering versus syncope.  Will get EKG, monitor on telemetry, get labs, drug screen, alcohol level, hCG.  We will send patient for a CT head as part of her new onset of seizure evaluation and CT abdomen pelvis to eval abdominal pain. Ddx for abdominal pain includes but not limited to diverticulitis, ovarian pathology, ectopic pregnancy, cystitis, appendicitis, kidney stone.  Clinical Course as of Apr 19 1832  Sun Apr 19, 2018  1710 Patient just attempted to run out of the ER and out of custody of police. She ran out of the room towards the door. Stopped by cops who were in the room with her.   [CV]    Clinical Course User Index [CV] Don Perking [place],  MD   _________________________ 6:35 PM on 04/19/2018 -----------------------------------------  At this time I believe the patient's presentation was due to malingering since she had 2 seizures but yet her bicarb and lactic acid are completely normal.  On a patient with no prior history of seizure I would expect some changes in her labs.  In any way, even if she had a seizure at this time there is no indication to start antiepileptic medication since this was her first one.  Her labs and head CT did not show any acute abnormalities.  CT abdomen pelvis showing just mild enteritis which is the cause of her abdominal pain.  Patient will be cleared for discharge back to jail.  Seizure precautions have been discussed with patient and is provided on her written discharge instructions.  Recommend to return to the emergency room if she has any other seizure-like activity.  As part of my medical decision making, I reviewed the following data within the electronic MEDICAL RECORD NUMBER Nursing notes reviewed and incorporated, Labs reviewed , EKG interpreted , Radiograph reviewed , Notes from prior ED visits and  Controlled Substance Database    Pertinent labs & imaging results that were available during my care of the patient were reviewed by me and considered in my medical decision making (see chart for details).  ____________________________________________   FINAL CLINICAL IMPRESSION(S) / ED DIAGNOSES  Final diagnoses:  Seizure-like activity (HCC)  Malingering  Enteritis      NEW MEDICATIONS STARTED DURING THIS VISIT:  ED Discharge Orders    None       Note:  This document was prepared using Dragon voice recognition software and may include unintentional dictation errors.    Don Perking, [place], MD 04/19/18 [place] Fruit

## 2018-04-19 NOTE — ED Notes (Signed)
Dr Veronese at bedside at this time.  

## 2018-04-19 NOTE — ED Notes (Signed)
Pt taken to CT. Accompanied by 2 [facility].

## 2018-04-19 NOTE — ED Notes (Signed)
Pt returned from CT at this time.  

## 2018-11-27 ENCOUNTER — Emergency Department

## 2018-11-27 ENCOUNTER — Emergency Department
Admission: EM | Admit: 2018-11-27 | Discharge: 2018-11-27 | Disposition: A | Discharge status: Home / Self Care | Attending: Emergency Medicine | Admitting: Emergency Medicine

## 2018-11-27 ENCOUNTER — Encounter: Payer: Self-pay | Admitting: *Deleted

## 2018-11-27 DIAGNOSIS — R55 Syncope and collapse: Secondary | ICD-10-CM

## 2018-11-27 DIAGNOSIS — Z79899 Other long term (current) drug therapy: Secondary | ICD-10-CM | POA: Insufficient documentation

## 2018-11-27 DIAGNOSIS — F17200 Nicotine dependence, unspecified, uncomplicated: Secondary | ICD-10-CM | POA: Insufficient documentation

## 2018-11-27 LAB — CBC WITH DIFFERENTIAL/PLATELET
ABS IMMATURE GRANULOCYTES: 0.03 10*3/uL (ref 0.00–0.07)
Basophils Absolute: 0.1 10*3/uL (ref 0.0–0.1)
Basophils Relative: 1 %
Eosinophils Absolute: 0.2 10*3/uL (ref 0.0–0.5)
Eosinophils Relative: 2 %
HEMATOCRIT: 43.5 % (ref 36.0–46.0)
HEMOGLOBIN: 14.4 g/dL (ref 12.0–15.0)
IMMATURE GRANULOCYTES: 0 %
LYMPHS ABS: 1.9 10*3/uL (ref 0.7–4.0)
LYMPHS PCT: 18 %
MCH: 30.6 pg (ref 26.0–34.0)
MCHC: 33.1 g/dL (ref 30.0–36.0)
MCV: 92.6 fL (ref 80.0–100.0)
Monocytes Absolute: 0.7 10*3/uL (ref 0.1–1.0)
Monocytes Relative: 7 %
NEUTROS ABS: 7.6 10*3/uL (ref 1.7–7.7)
NEUTROS PCT: 72 %
Platelets: 236 10*3/uL (ref 150–400)
RBC: 4.7 MIL/uL (ref 3.87–5.11)
RDW: 13.2 % (ref 11.5–15.5)
WBC: 10.5 10*3/uL (ref 4.0–10.5)
nRBC: 0 % (ref 0.0–0.2)

## 2018-11-27 LAB — BASIC METABOLIC PANEL
ANION GAP: 7 (ref 5–15)
BUN: 12 mg/dL (ref 6–20)
CO2: 29 mmol/L (ref 22–32)
Calcium: 8.6 mg/dL — ABNORMAL LOW (ref 8.9–10.3)
Chloride: 100 mmol/L (ref 98–111)
Creatinine, Ser: 0.83 mg/dL (ref 0.44–1.00)
GFR calc Af Amer: >60 mL/min (ref >60)
GFR calc non Af Amer: >60 mL/min (ref >60)
GLUCOSE: 225 mg/dL — AB (ref 70–99)
POTASSIUM: 3.1 mmol/L — AB (ref 3.5–5.1)
Sodium: 136 mmol/L (ref 135–145)

## 2018-11-27 LAB — ETHANOL: Alcohol, Ethyl (B): <10 mg/dL (ref <10)

## 2018-11-27 LAB — TROPONIN I: Troponin I: <0.03 ng/mL (ref <0.03)

## 2018-11-27 NOTE — ED Notes (Signed)
Information has been given to [employer] officer to have [place] police investigate pt and ensure IV is out and investigate stolen items.

## 2018-11-27 NOTE — ED Notes (Signed)
Pt was not found in room when NT went in to check on her. Pt left one black shoe on the floor. Pt still had IV in place and also hospital ASCOM. [employer] and security made aware.   Charge RN contacted pt's brother who states that he is unable to give [place] pt's cell number but would contact the pt for [place] to get her to come back to ED and give [place] a call back.

## 2018-11-27 NOTE — ED Notes (Signed)
Pt informed about need for urine. Pt states that as soon as she feels the urge she will press the call light.

## 2018-11-27 NOTE — ED Notes (Addendum)
In addition to RNs previous note:   This RN talked to brother of patient and requested contact information further than Demographics in Epic. Brother stated, "I can not give you that information. Her leaving just confirms the type of sister she is." At this time RN informed brother that pt took the ascom phone, BP cuff and left with IV in place and the police would need to be involved if she did not return items and proof IV was out. Brother was very surprised and asked what could be done to prevent the police from being involved. RN told brother he could have patient return. Brother informed nurse he would contact his sister and call back. At this time brother still has not called back.

## 2018-11-27 NOTE — ED Triage Notes (Signed)
Pt to ED via EMS from home. EMS was called out for "cardiac arrest" but EMS reports pt had a pulse upon their arrival to pts home. Pt was unconcious upon EMS arrival. 94% once placed in NR[facility]. Pt last remebers eating food and thinking about taking tylenol for a headache and then woke up on the EMS truck. PT verbalized having been under increased stress after her fathers death.   Pt is 96% on 4L upon arrival. \

## 2018-11-27 NOTE — ED Notes (Signed)
Patient transported to X-ray 
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
## 2019-05-20 ENCOUNTER — Emergency Department: Payer: No Typology Code available for payment source

## 2019-05-20 ENCOUNTER — Other Ambulatory Visit: Payer: Self-pay

## 2019-05-20 ENCOUNTER — Emergency Department
Admission: EM | Admit: 2019-05-20 | Discharge: 2019-05-21 | Payer: No Typology Code available for payment source | Attending: Emergency Medicine | Admitting: Emergency Medicine

## 2019-05-20 DIAGNOSIS — S31821A Laceration without foreign body of left buttock, initial encounter: Secondary | ICD-10-CM | POA: Diagnosis not present

## 2019-05-20 DIAGNOSIS — Y9289 Other specified places as the place of occurrence of the external cause: Secondary | ICD-10-CM | POA: Insufficient documentation

## 2019-05-20 DIAGNOSIS — Y9389 Activity, other specified: Secondary | ICD-10-CM | POA: Insufficient documentation

## 2019-05-20 DIAGNOSIS — S41112A Laceration without foreign body of left upper arm, initial encounter: Secondary | ICD-10-CM | POA: Insufficient documentation

## 2019-05-20 DIAGNOSIS — Y998 Other external cause status: Secondary | ICD-10-CM | POA: Insufficient documentation

## 2019-05-20 DIAGNOSIS — S4981XA Other specified injuries of right shoulder and upper arm, initial encounter: Secondary | ICD-10-CM | POA: Diagnosis present

## 2019-05-20 DIAGNOSIS — W540XXA Bitten by dog, initial encounter: Secondary | ICD-10-CM | POA: Diagnosis not present

## 2019-05-20 DIAGNOSIS — S41111A Laceration without foreign body of right upper arm, initial encounter: Secondary | ICD-10-CM | POA: Insufficient documentation

## 2019-05-20 LAB — BASIC METABOLIC PANEL
Anion gap: 13 (ref 5–15)
BUN: 15 mg/dL (ref 6–20)
CO2: 21 mmol/L — ABNORMAL LOW (ref 22–32)
Calcium: 8.8 mg/dL — ABNORMAL LOW (ref 8.9–10.3)
Chloride: 103 mmol/L (ref 98–111)
Creatinine, Ser: 0.86 mg/dL (ref 0.44–1.00)
GFR calc Af Amer: >60 mL/min (ref >60)
GFR calc non Af Amer: >60 mL/min (ref >60)
Glucose, Bld: 106 mg/dL — ABNORMAL HIGH (ref 70–99)
Potassium: 3.2 mmol/L — ABNORMAL LOW (ref 3.5–5.1)
Sodium: 137 mmol/L (ref 135–145)

## 2019-05-20 LAB — CBC
HCT: 42 % (ref 36.0–46.0)
Hemoglobin: 13.8 g/dL (ref 12.0–15.0)
MCH: 29.2 pg (ref 26.0–34.0)
MCHC: 32.9 g/dL (ref 30.0–36.0)
MCV: 89 fL (ref 80.0–100.0)
Platelets: 323 10*3/uL (ref 150–400)
RBC: 4.72 MIL/uL (ref 3.87–5.11)
RDW: 12.7 % (ref 11.5–15.5)
WBC: 10 10*3/uL (ref 4.0–10.5)
nRBC: 0 % (ref 0.0–0.2)

## 2019-05-20 MED ORDER — LIDOCAINE-EPINEPHRINE-TETRACAINE (LET) SOLUTION
3.0000 mL | Freq: Once | NASAL | Status: AC
  Administered 2019-05-20: 3 mL via TOPICAL

## 2019-05-20 MED ORDER — SODIUM CHLORIDE 0.9 % IV BOLUS
1000.0000 mL | Freq: Once | INTRAVENOUS | Status: AC
  Administered 2019-05-21: 1000 mL via INTRAVENOUS

## 2019-05-20 MED ORDER — SODIUM CHLORIDE 0.9 % IV BOLUS
1000.0000 mL | Freq: Once | INTRAVENOUS | Status: AC
  Administered 2019-05-20: 1000 mL via INTRAVENOUS

## 2019-05-20 MED ORDER — LIDOCAINE HCL (PF) 1 % IJ SOLN
INTRAMUSCULAR | Status: AC
  Filled 2019-05-20: qty 5

## 2019-05-20 MED ORDER — LORAZEPAM 2 MG/ML IJ SOLN
1.0000 mg | Freq: Once | INTRAMUSCULAR | Status: AC
  Administered 2019-05-20: 1 mg via INTRAVENOUS
  Filled 2019-05-20: qty 1

## 2019-05-20 MED ORDER — LIDOCAINE HCL (PF) 1 % IJ SOLN
5.0000 mL | Freq: Once | INTRAMUSCULAR | Status: AC
  Administered 2019-05-20: 5 mL via INTRADERMAL

## 2019-05-20 NOTE — ED Notes (Signed)
This RN also drew blood for labs
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
## 2019-05-20 NOTE — ED Notes (Signed)
Unable to collect blood sample for lab tests after several attempts by this RN and Beth, EDT. Lab called and will send a phlebotomist to collect blood as soon as available.
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
## 2019-05-20 NOTE — ED Notes (Signed)
Patient transported to CT at this time. 

## 2019-05-21 LAB — ETHANOL: Alcohol, Ethyl (B): <10 mg/dL (ref <10)

## 2019-05-21 MED ORDER — AMOXICILLIN-POT CLAVULANATE 875-125 MG PO TABS: 1.0000 | ORAL_TABLET | Freq: Two times a day (BID) | ORAL | 0 refills | Status: DC
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
## 2019-05-21 NOTE — ED Notes (Signed)
Patient resting.

## 2019-05-21 NOTE — Discharge Instructions (Signed)
Take antibiotic as prescribed (Augmentin). Suture removal in 7-10 days. Return to the ER for worsening symptoms, increased redness/swelling, purulent discharge or other concerns.
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
## 2020-02-08 ENCOUNTER — Emergency Department
Admission: EM | Admit: 2020-02-08 | Discharge: 2020-02-08 | Disposition: A | Discharge status: Home / Self Care | Payer: Medicaid Other | Attending: Emergency Medicine | Admitting: Emergency Medicine

## 2020-02-08 ENCOUNTER — Other Ambulatory Visit: Payer: Self-pay

## 2020-02-08 DIAGNOSIS — F17200 Nicotine dependence, unspecified, uncomplicated: Secondary | ICD-10-CM | POA: Insufficient documentation

## 2020-02-08 DIAGNOSIS — L03116 Cellulitis of left lower limb: Secondary | ICD-10-CM | POA: Insufficient documentation

## 2020-02-08 LAB — CBC WITH DIFFERENTIAL/PLATELET
Abs Immature Granulocytes: 0.01 10*3/uL (ref 0.00–0.07)
Basophils Absolute: 0.1 10*3/uL (ref 0.0–0.1)
Basophils Relative: 1 %
Eosinophils Absolute: 0.2 10*3/uL (ref 0.0–0.5)
Eosinophils Relative: 3 %
HCT: 42.8 % (ref 36.0–46.0)
Hemoglobin: 14.5 g/dL (ref 12.0–15.0)
Immature Granulocytes: 0 %
Lymphocytes Relative: 29 %
Lymphs Abs: 2 10*3/uL (ref 0.7–4.0)
MCH: 30.3 pg (ref 26.0–34.0)
MCHC: 33.9 g/dL (ref 30.0–36.0)
MCV: 89.4 fL (ref 80.0–100.0)
Monocytes Absolute: 0.7 10*3/uL (ref 0.1–1.0)
Monocytes Relative: 10 %
Neutro Abs: 3.8 10*3/uL (ref 1.7–7.7)
Neutrophils Relative %: 57 %
Platelets: 240 10*3/uL (ref 150–400)
RBC: 4.79 MIL/uL (ref 3.87–5.11)
RDW: 11.9 % (ref 11.5–15.5)
WBC: 6.8 10*3/uL (ref 4.0–10.5)
nRBC: 0 % (ref 0.0–0.2)

## 2020-02-08 LAB — POCT PREGNANCY, URINE: Preg Test, Ur: NEGATIVE

## 2020-02-08 LAB — URINALYSIS, COMPLETE (UACMP) WITH MICROSCOPIC
Bacteria, UA: NONE SEEN
Glucose, UA: NEGATIVE mg/dL
Hgb urine dipstick: NEGATIVE
Ketones, ur: NEGATIVE mg/dL
Leukocytes,Ua: NEGATIVE
Nitrite: NEGATIVE
Protein, ur: 100 mg/dL — AB
Specific Gravity, Urine: 1.024 (ref 1.005–1.030)
pH: 6 (ref 5.0–8.0)

## 2020-02-08 LAB — COMPREHENSIVE METABOLIC PANEL
ALT: 85 U/L — ABNORMAL HIGH (ref 0–44)
AST: 90 U/L — ABNORMAL HIGH (ref 15–41)
Albumin: 3.8 g/dL (ref 3.5–5.0)
Alkaline Phosphatase: 66 U/L (ref 38–126)
Anion gap: 8 (ref 5–15)
BUN: 9 mg/dL (ref 6–20)
CO2: 27 mmol/L (ref 22–32)
Calcium: 9.3 mg/dL (ref 8.9–10.3)
Chloride: 102 mmol/L (ref 98–111)
Creatinine, Ser: 0.68 mg/dL (ref 0.44–1.00)
GFR calc Af Amer: >60 mL/min (ref >60)
GFR calc non Af Amer: >60 mL/min (ref >60)
Glucose, Bld: 128 mg/dL — ABNORMAL HIGH (ref 70–99)
Potassium: 3.4 mmol/L — ABNORMAL LOW (ref 3.5–5.1)
Sodium: 137 mmol/L (ref 135–145)
Total Bilirubin: 1.1 mg/dL (ref 0.3–1.2)
Total Protein: 7.2 g/dL (ref 6.5–8.1)

## 2020-02-08 LAB — LACTIC ACID, PLASMA: Lactic Acid, Venous: 1 mmol/L (ref 0.5–1.9)

## 2020-02-08 MED ORDER — NAPROXEN 500 MG PO TABS: 500.0000 mg | ORAL_TABLET | Freq: Two times a day (BID) | ORAL | 2 refills | Status: AC

## 2020-02-08 MED ORDER — DOXYCYCLINE HYCLATE 100 MG PO TABS
100.0000 mg | ORAL_TABLET | Freq: Once | ORAL | Status: AC
  Administered 2020-02-08: 17:00:00 100 mg via ORAL
  Filled 2020-02-08: qty 1

## 2020-02-08 MED ORDER — SODIUM CHLORIDE 0.9% FLUSH: 3.0000 mL | Freq: Once | INTRAVENOUS | Status: DC

## 2020-02-08 MED ORDER — DOXYCYCLINE HYCLATE 100 MG PO TABS: 100.0000 mg | ORAL_TABLET | Freq: Two times a day (BID) | ORAL | 0 refills | Status: AC

## 2020-02-08 NOTE — ED Provider Notes (Signed)
[facility] Emergency Department Provider Note   ____________________________________________    I have reviewed the triage vital signs and the nursing notes.   HISTORY  Chief Complaint Leg Swelling     HPI Angelica Sampson is a 39 y.o. female who presents with complaints of leg swelling and burning sensation.  Patient reports that she suffered a small cut to the bottom of her left foot several days ago, she has developed some mild redness extending from the area up her lower extremity.  She describes the pain is burning and mild in nature.  Afebrile.  No history of diabetes.  Is not take anything for this.  Past Medical History:  Diagnosis Date  . History of lithotripsy   . Kidney calculi     There are no problems to display for this patient.   History reviewed. No pertinent surgical history.  Prior to Admission medications   Medication Sig Start Date End Date Taking? Authorizing Provider  doxycycline (VIBRA-TABS) 100 MG tablet Take 1 tablet (100 mg total) by mouth 2 (two) times daily. 02/08/20   Lavonia Drafts, MD  naproxen (NAPROSYN) 500 MG tablet Take 1 tablet (500 mg total) by mouth 2 (two) times daily with a meal. 02/08/20   Lavonia Drafts, MD  SUBOXONE 8-2 MG FILM Place 1 Film under the tongue 2 (two) times daily. 03/24/18   [provider]     Allergies Hydrocodone  No family history on file.  Social History Social History   Tobacco Use  . Smoking status: Current Every Day Smoker    Packs/day: 1.00  . Smokeless tobacco: Never Used  Substance Use Topics  . Alcohol use: Yes    Comment: intoxicated but cooperative  . Drug use: No    Review of Systems  Constitutional: No fever/chills    Gastrointestinal:  No nausea, no vomiting.    Musculoskeletal: As above, no calf swelling Skin: As above     ____________________________________________   PHYSICAL EXAM:  VITAL SIGNS: ED Triage Vitals  Enc Vitals Group      BP 02/08/20 1430 114/69     Pulse Rate 02/08/20 1430 84     Resp 02/08/20 1430 16     Temp 02/08/20 1430 98 F (36.7 C)     Temp Source 02/08/20 1430 Oral     SpO2 02/08/20 1430 99 %     Weight 02/08/20 1428 69.9 kg (154 lb)     Height 02/08/20 1428 1.651 m (5\' 5" )     Head Circumference --      Peak Flow --      Pain Score 02/08/20 1428 8     Pain Loc --      Pain Edu? --      Excl. in [place]? --      Constitutional: Alert and oriented. No acute distress. Pleasant and interactive  Nose: No congestion/rhinnorhea. Mouth/Throat: Mucous membranes are moist.   Cardiovascular: Normal rate, regular rhythm.  Respiratory: Normal respiratory effort.  No retractions.  Musculoskeletal: Mild erythema extending from the heel up the left lower extremity, no swelling, no abscess Neurologic:  Normal speech and language. No gross focal neurologic deficits are appreciated.   Skin:  Skin is warm, dry.  See above.  Has significant cracked dry skin on the bottom of her feet   ____________________________________________   LABS (all labs ordered are listed, but only abnormal results are displayed)  Labs Reviewed  COMPREHENSIVE METABOLIC PANEL - Abnormal; Notable for the  following components:      Result Value   Potassium 3.4 (*)    Glucose, Bld 128 (*)    AST 90 (*)    ALT 85 (*)    All other components within normal limits  URINALYSIS, COMPLETE (UACMP) WITH MICROSCOPIC - Abnormal; Notable for the following components:   Color, Urine AMBER (*)    APPearance HAZY (*)    Bilirubin Urine SMALL (*)    Protein, ur 100 (*)    All other components within normal limits  LACTIC ACID, PLASMA  CBC WITH DIFFERENTIAL/PLATELET  LACTIC ACID, PLASMA  POCT PREGNANCY, URINE  POC URINE PREG, ED   ____________________________________________  EKG   ____________________________________________  RADIOLOGY  None ____________________________________________   PROCEDURES  Procedure(s)  performed: No  Procedures   Critical Care performed: No ____________________________________________   INITIAL IMPRESSION / ASSESSMENT AND PLAN / ED COURSE  Pertinent labs & imaging results that were available during my care of the patient were reviewed by me and considered in my medical decision making (see chart for details).  Patient well-appearing in no acute distress.  Afebrile, reassuring vital signs.  Lab work is unremarkable, normal white blood cell count.  Exam is most consistent with cellulitis secondary to skin break.  No purulence or suspicion for abscess.  Will start on doxycycline return precautions discussed.   ____________________________________________   FINAL CLINICAL IMPRESSION(S) / ED DIAGNOSES  Final diagnoses:  Cellulitis of left lower extremity      NEW MEDICATIONS STARTED DURING THIS VISIT:  Discharge Medication List as of 02/08/2020  4:44 PM    START taking these medications   Details  doxycycline (VIBRA-TABS) 100 MG tablet Take 1 tablet (100 mg total) by mouth 2 (two) times daily., Starting Tue 02/08/2020, Print    naproxen (NAPROSYN) 500 MG tablet Take 1 tablet (500 mg total) by mouth 2 (two) times daily with a meal., Starting Tue 02/08/2020, Print         Note:  This document was prepared using Dragon voice recognition software and may include unintentional dictation errors.   Jene Every, MD 02/08/20 1742

## 2020-02-08 NOTE — ED Notes (Signed)
Pt verbalized understanding of discharge instructions. NAD at this time. 
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
## 2021-03-08 ENCOUNTER — Emergency Department (HOSPITAL_COMMUNITY): Payer: Medicaid Other

## 2021-03-08 ENCOUNTER — Emergency Department (HOSPITAL_COMMUNITY)
Admission: EM | Admit: 2021-03-08 | Discharge: 2021-03-08 | Disposition: A | Discharge status: Home / Self Care | Payer: Medicaid Other | Attending: Emergency Medicine | Admitting: Emergency Medicine

## 2021-03-08 ENCOUNTER — Encounter (HOSPITAL_COMMUNITY): Payer: Self-pay | Admitting: Emergency Medicine

## 2021-03-08 ENCOUNTER — Other Ambulatory Visit: Payer: Self-pay

## 2021-03-08 DIAGNOSIS — W01198A Fall on same level from slipping, tripping and stumbling with subsequent striking against other object, initial encounter: Secondary | ICD-10-CM | POA: Insufficient documentation

## 2021-03-08 DIAGNOSIS — S93601A Unspecified sprain of right foot, initial encounter: Secondary | ICD-10-CM | POA: Insufficient documentation

## 2021-03-08 DIAGNOSIS — W19XXXA Unspecified fall, initial encounter: Secondary | ICD-10-CM

## 2021-03-08 DIAGNOSIS — F17210 Nicotine dependence, cigarettes, uncomplicated: Secondary | ICD-10-CM | POA: Insufficient documentation

## 2021-03-08 MED ORDER — HYDROXYZINE HCL 25 MG PO TABS
50.0000 mg | ORAL_TABLET | Freq: Once | ORAL | Status: AC
  Administered 2021-03-08: 50 mg via ORAL
  Filled 2021-03-08: qty 2

## 2021-03-08 MED ORDER — PERMETHRIN 5 % EX CREA: TOPICAL_CREAM | CUTANEOUS | 0 refills | Status: AC

## 2021-03-08 NOTE — ED Provider Notes (Signed)
[facility] EMERGENCY DEPARTMENT Provider Note   CSN: [id number] Arrival date & time: 03/08/21  1138     History Chief Complaint  Patient presents with  . Foot Pain    Angelica Sampson is a 40 y.o. female.  40 yo F with a cc of right foot pain.  Thinks she struck it on something.  Also concerned about insect bites.  No fevers, chills.   The history is provided by the patient.  Foot Pain This is a new problem. The current episode started yesterday. The problem occurs constantly. The problem has not changed since onset.Pertinent negatives include no chest pain, no headaches and no shortness of breath. The symptoms are aggravated by bending, twisting and walking. Nothing relieves the symptoms. She has tried nothing for the symptoms.       Past Medical History:  Diagnosis Date  . History of lithotripsy   . Kidney calculi     There are no problems to display for this patient.   History reviewed. No pertinent surgical history.   OB History    Gravida  1   Para      Term      Preterm      AB      Living        SAB      IAB      Ectopic      Multiple      Live Births              History reviewed. No pertinent family history.  Social History   Tobacco Use  . Smoking status: Current Every Day Smoker    Packs/day: 1.00  . Smokeless tobacco: Never Used  Substance Use Topics  . Alcohol use: Yes    Comment: intoxicated but cooperative  . Drug use: No    Home Medications Prior to Admission medications   Medication Sig Start Date End Date Taking? Authorizing Provider  permethrin (ELIMITE) 5 % cream Apply to affected area once 03/08/21  Yes Melene Plan, DO  doxycycline (VIBRA-TABS) 100 MG tablet Take 1 tablet (100 mg total) by mouth 2 (two) times daily. 02/08/20   Jene Every, MD  naproxen (NAPROSYN) 500 MG tablet Take 1 tablet (500 mg total) by mouth 2 (two) times daily with a meal. 02/08/20   Jene Every, MD  SUBOXONE 8-2 MG  FILM Place 1 Film under the tongue 2 (two) times daily. 03/24/18   [provider]    Allergies    Hydrocodone  Review of Systems   Review of Systems  Constitutional: Negative for chills and fever.  HENT: Negative for congestion and rhinorrhea.   Eyes: Negative for redness and visual disturbance.  Respiratory: Negative for shortness of breath and wheezing.   Cardiovascular: Negative for chest pain and palpitations.  Gastrointestinal: Negative for nausea and vomiting.  Genitourinary: Negative for dysuria and urgency.  Musculoskeletal: Positive for arthralgias and gait problem. Negative for myalgias.  Skin: Negative for pallor and wound.  Neurological: Negative for dizziness and headaches.    Physical Exam Updated Vital Signs BP (!) 116/99 (BP Location: Left Arm)   Pulse 80   Temp 98.4 F (36.9 C) (Oral)   Resp 18   SpO2 100%   Physical Exam Vitals and nursing note reviewed.  Constitutional:      General: She is not in acute distress.    Appearance: She is well-developed. She is not diaphoretic.  HENT:     Head:  Normocephalic and atraumatic.  Eyes:     Pupils: Pupils are equal, round, and reactive to light.  Cardiovascular:     Rate and Rhythm: Normal rate and regular rhythm.     Heart sounds: No murmur heard. No friction rub. No gallop.   Pulmonary:     Effort: Pulmonary effort is normal.     Breath sounds: No wheezing or rales.  Abdominal:     General: There is no distension.     Palpations: Abdomen is soft.     Tenderness: There is no abdominal tenderness.  Musculoskeletal:        General: No tenderness.     Cervical back: Normal range of motion and neck supple.     Comments: Bilateral swelling and pain to the feet, complaining of right lateral foot pain.  Mild focal swelling, no crepitus.  No erythema, warmth, fluctuance or induration.   Skin:    General: Skin is warm and dry.  Neurological:     Mental Status: She is alert and oriented to person,  place, and time.  Psychiatric:        Behavior: Behavior normal.     ED Results / Procedures / Treatments   Labs (all labs ordered are listed, but only abnormal results are displayed) Labs Reviewed - No data to display  EKG None  Radiology DG Foot Complete Right  Result Date: 03/08/2021 CLINICAL DATA:  Trauma to foot with pain. EXAM: RIGHT FOOT COMPLETE - 3+ VIEW COMPARISON:  None. FINDINGS: There is no evidence of fracture or dislocation. There is no evidence of arthropathy or other focal bone abnormality. Soft tissues are unremarkable. IMPRESSION: Negative. Electronically Signed   By: Romona Curls M.D.   On: 03/08/2021 12:54    Procedures Procedures   Medications Ordered in ED Medications  hydrOXYzine (ATARAX/VISTARIL) tablet 50 mg (50 mg Oral Given 03/08/21 1302)    ED Course  I have reviewed the triage vital signs and the nursing notes.  Pertinent labs & imaging results that were available during my care of the patient were reviewed by me and considered in my medical decision making (see chart for details).    MDM Rules/Calculators/A&P                          40 yo F with a chief complaints of right foot pain.  She thinks that she banged it yesterday.  Plain film viewed by me without fracture.  She is complaining of insect bites mostly upon the face and the upper chest.  She is complaining of some itching between her fingers I do not appreciate any intertriginous lesions but I will treat her with permethrin.  Have her follow-up with her family doctor.  2:09 PM:  I have discussed the diagnosis/risks/treatment options with the patient and believe the pt to be eligible for discharge home to follow-up with PCP. We also discussed returning to the ED immediately if new or worsening sx occur. We discussed the sx which are most concerning (e.g., sudden worsening pain, fever, inability to tolerate by mouth) that necessitate immediate return. Medications administered to the patient  during their visit and any new prescriptions provided to the patient are listed below.  Medications given during this visit Medications  hydrOXYzine (ATARAX/VISTARIL) tablet 50 mg (50 mg Oral Given 03/08/21 1302)     The patient appears reasonably screen and/or stabilized for discharge and I doubt any other medical condition or other [facility] requiring further  screening, evaluation, or treatment in the ED at this time prior to discharge.   Final Clinical Impression(s) / ED Diagnoses Final diagnoses:  Fall  Sprain of right foot, initial encounter    Rx / DC Orders ED Discharge Orders         Ordered    permethrin (ELIMITE) 5 % cream        03/08/21 1258           Melene Plan, DO 03/08/21 1409

## 2021-03-08 NOTE — Discharge Instructions (Addendum)
Take 4 over the counter ibuprofen tablets 3 times a day or 2 over-the-counter naproxen tablets twice a day for pain. Also take tylenol 1000mg(2 extra strength) four times a day.    

## 2021-03-08 NOTE — ED Triage Notes (Signed)
Pt states she hit her right foot this morning and thinks it may be broken. Pt also complains of chigger bites all over her.

## 2021-09-02 IMAGING — DX DG FOOT COMPLETE 3+V*R*
3 series · 3 of 3 positions shown · non-contrast
Comparison: None.

CLINICAL DATA: Trauma to foot with pain.

EXAM:
RIGHT FOOT COMPLETE - 3+ VIEW

[foot ap]
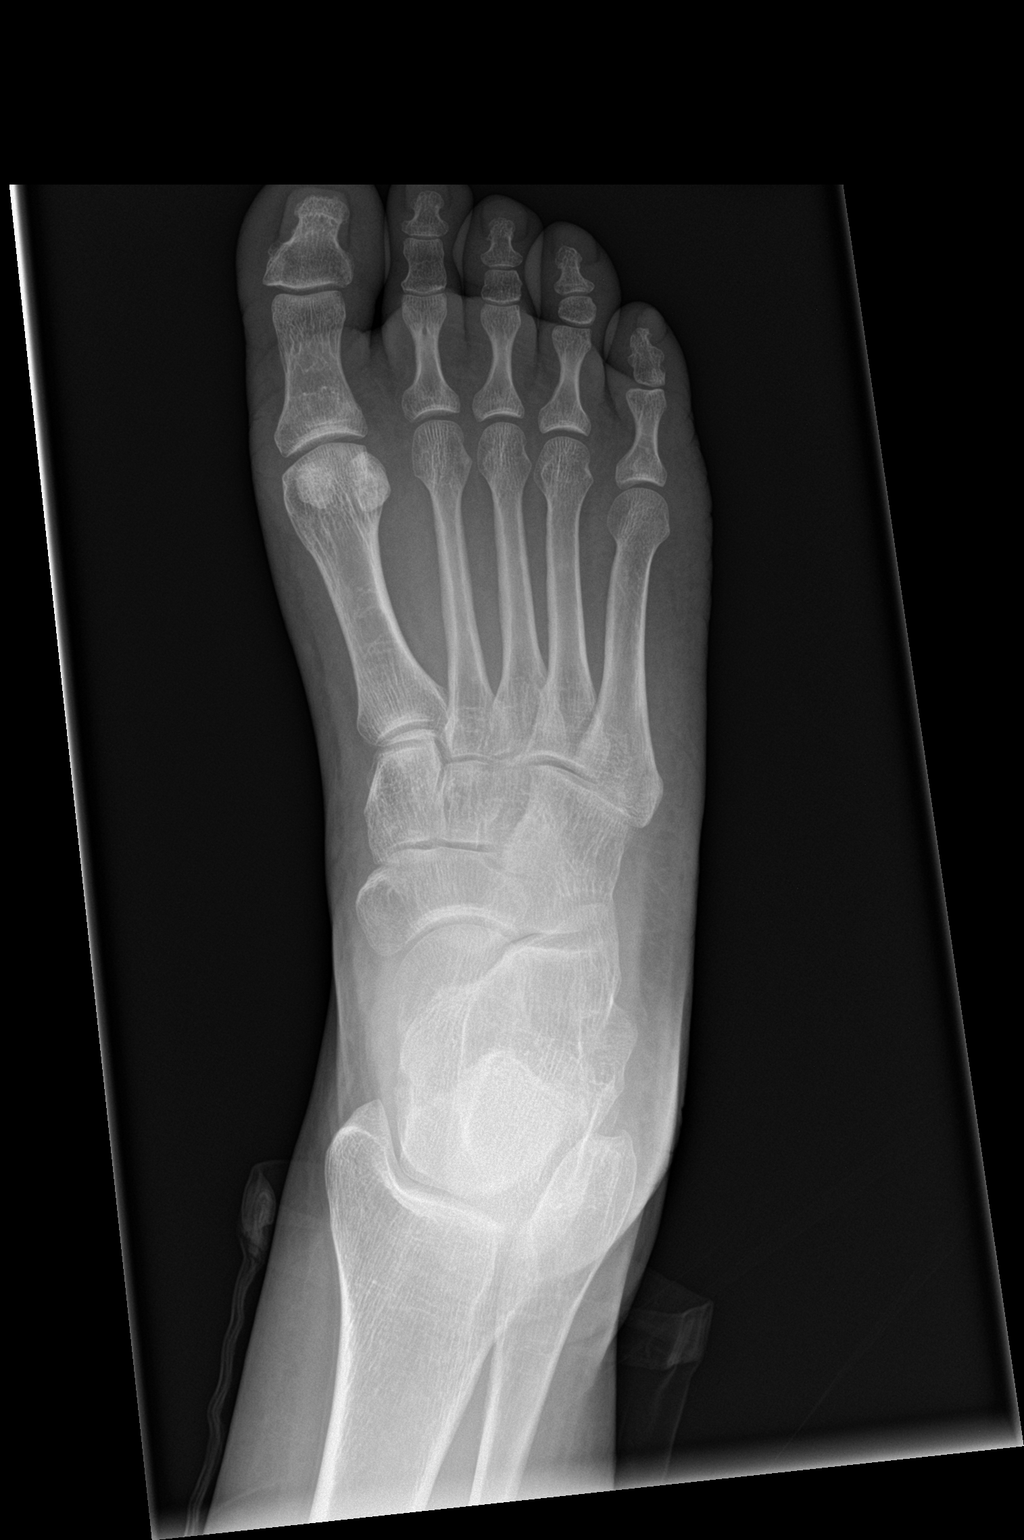

[foot obl]
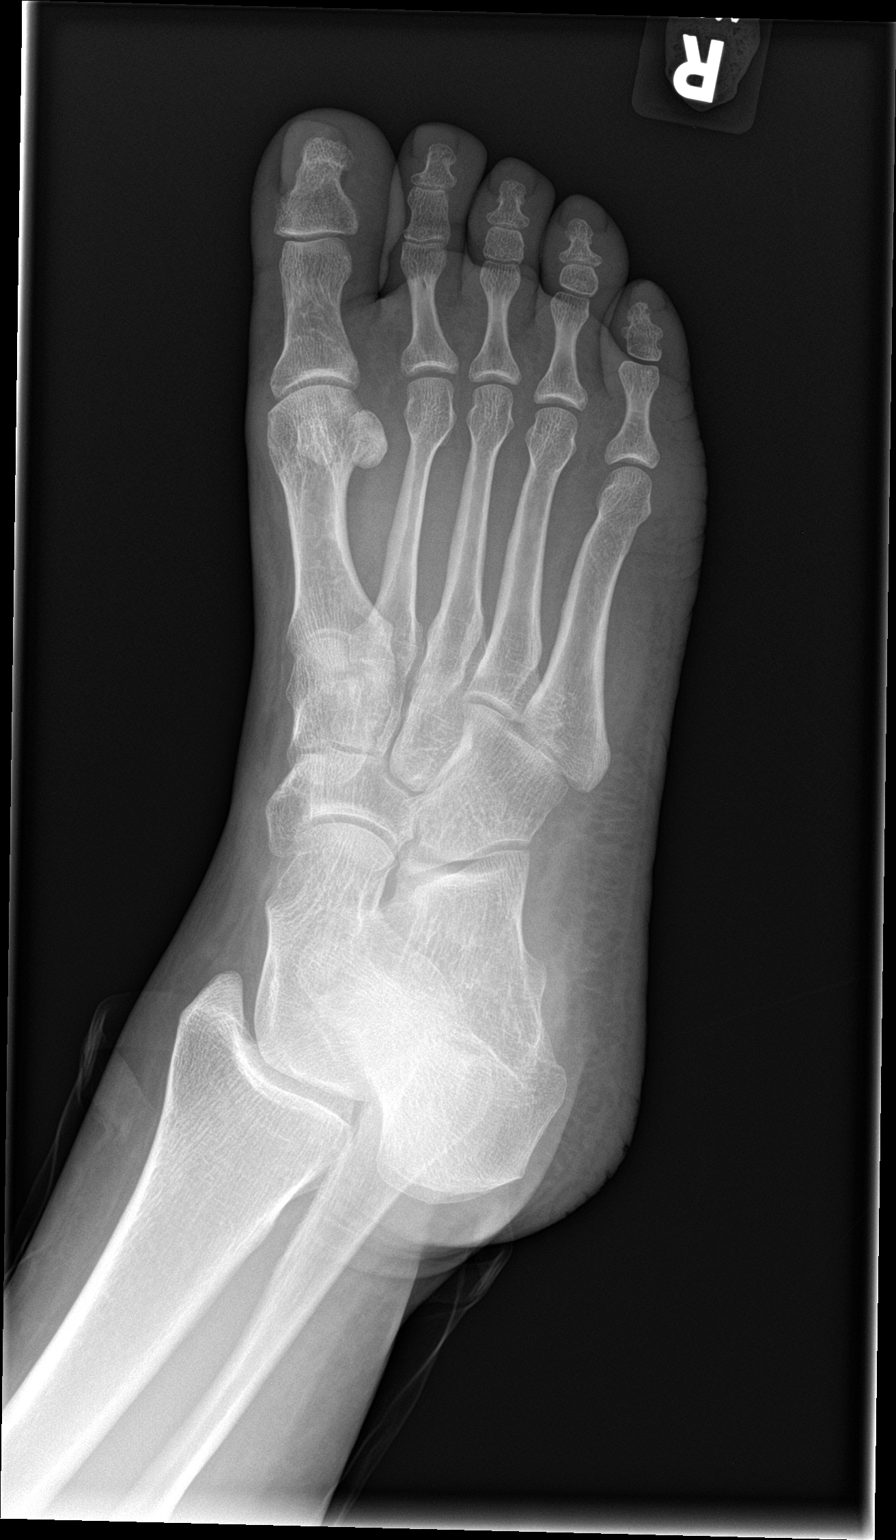

[foot lat]
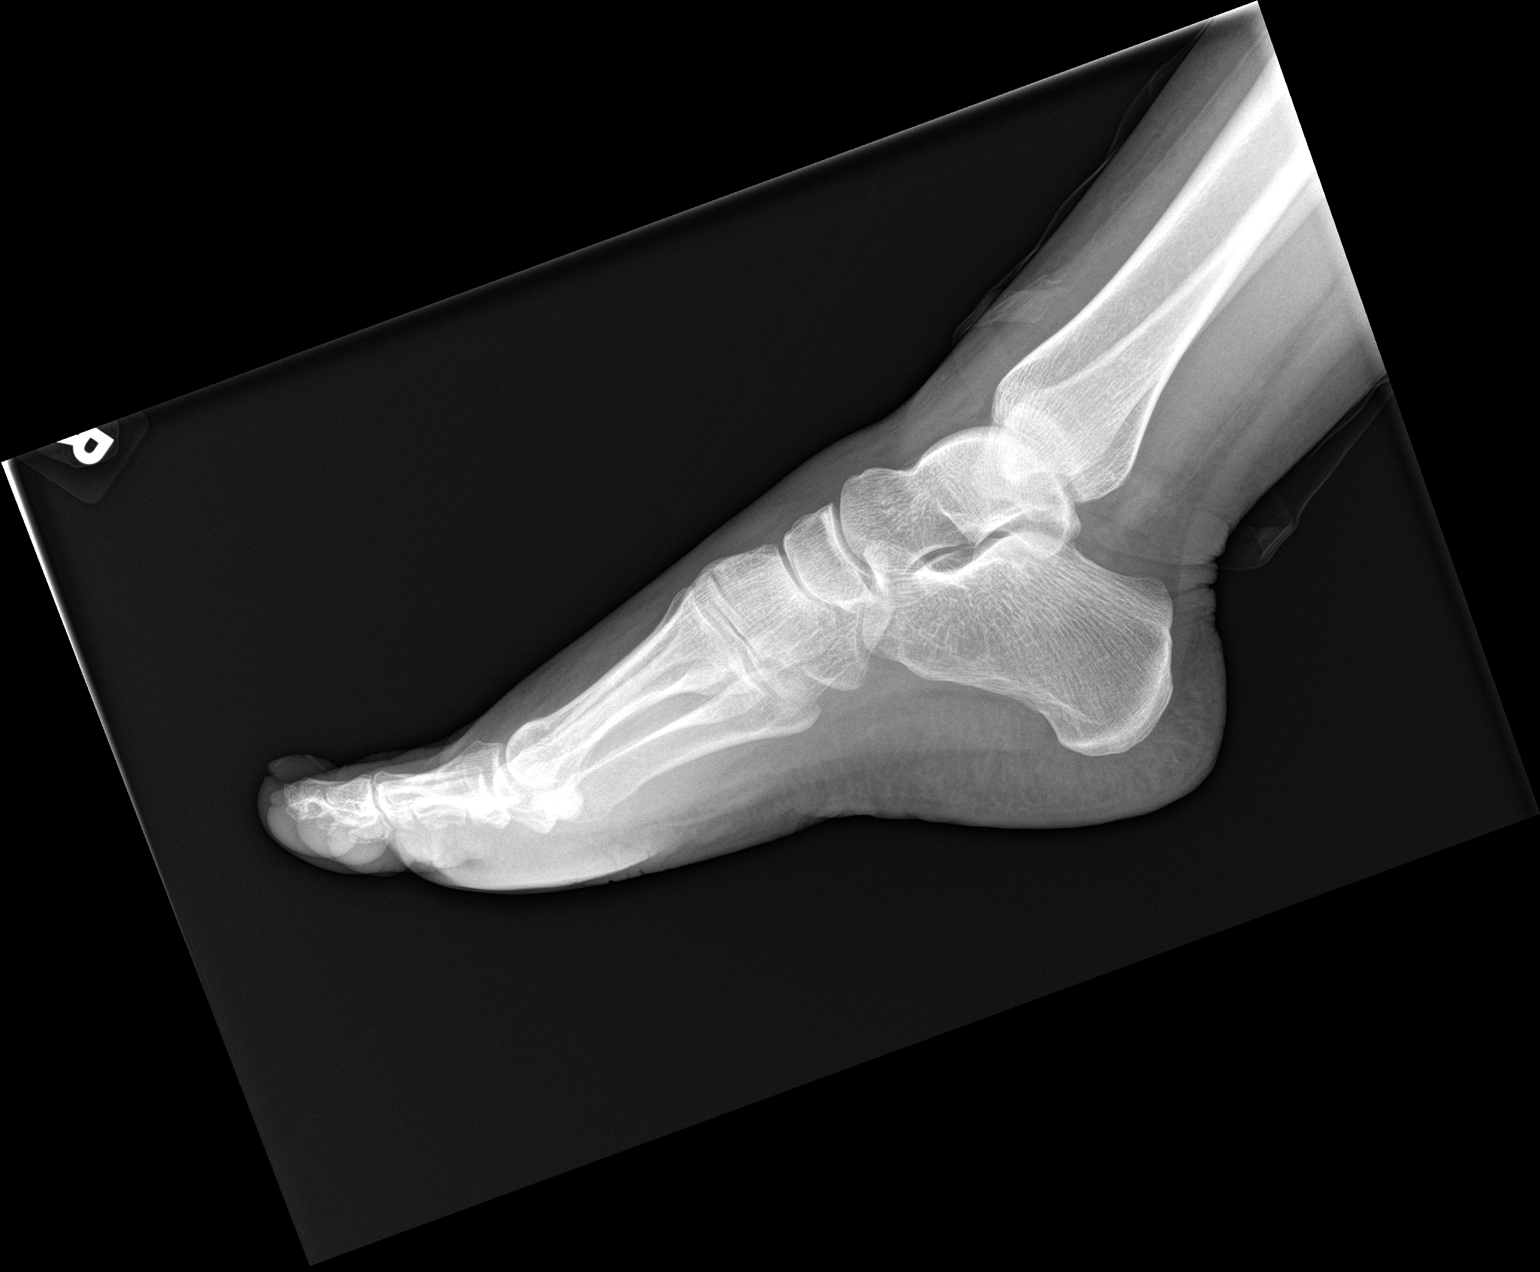

[3 of 3 positions shown; findings below may reference images not displayed]

FINDINGS: There is no evidence of fracture or dislocation. There is no
evidence of arthropathy or other focal bone abnormality. Soft
tissues are unremarkable.
IMPRESSION: Negative.

## 2022-04-26 ENCOUNTER — Emergency Department
Admission: EM | Admit: 2022-04-26 | Discharge: 2022-04-26 | Disposition: A | Discharge status: Home / Self Care | Payer: Medicaid Other | Attending: Emergency Medicine | Admitting: Emergency Medicine

## 2022-04-26 ENCOUNTER — Emergency Department: Payer: Medicaid Other

## 2022-04-26 ENCOUNTER — Other Ambulatory Visit: Payer: Self-pay

## 2022-04-26 DIAGNOSIS — R55 Syncope and collapse: Secondary | ICD-10-CM | POA: Insufficient documentation

## 2022-04-26 DIAGNOSIS — E860 Dehydration: Secondary | ICD-10-CM | POA: Insufficient documentation

## 2022-04-26 DIAGNOSIS — R464 Slowness and poor responsiveness: Secondary | ICD-10-CM | POA: Diagnosis not present

## 2022-04-26 DIAGNOSIS — R4182 Altered mental status, unspecified: Secondary | ICD-10-CM | POA: Insufficient documentation

## 2022-04-26 LAB — COMPREHENSIVE METABOLIC PANEL
ALT: 68 U/L — ABNORMAL HIGH (ref 0–44)
AST: 85 U/L — ABNORMAL HIGH (ref 15–41)
Albumin: 3.8 g/dL (ref 3.5–5.0)
Alkaline Phosphatase: 67 U/L (ref 38–126)
Anion gap: 8 (ref 5–15)
BUN: 14 mg/dL (ref 6–20)
CO2: 31 mmol/L (ref 22–32)
Calcium: 9.8 mg/dL (ref 8.9–10.3)
Chloride: 103 mmol/L (ref 98–111)
Creatinine, Ser: 0.78 mg/dL (ref 0.44–1.00)
GFR, Estimated: >60 mL/min (ref >60)
Glucose, Bld: 110 mg/dL — ABNORMAL HIGH (ref 70–99)
Potassium: 4.3 mmol/L (ref 3.5–5.1)
Sodium: 142 mmol/L (ref 135–145)
Total Bilirubin: 0.3 mg/dL (ref 0.3–1.2)
Total Protein: 7.5 g/dL (ref 6.5–8.1)

## 2022-04-26 LAB — CBC
HCT: 42.4 % (ref 36.0–46.0)
Hemoglobin: 13.4 g/dL (ref 12.0–15.0)
MCH: 29.3 pg (ref 26.0–34.0)
MCHC: 31.6 g/dL (ref 30.0–36.0)
MCV: 92.8 fL (ref 80.0–100.0)
Platelets: 270 10*3/uL (ref 150–400)
RBC: 4.57 MIL/uL (ref 3.87–5.11)
RDW: 12.4 % (ref 11.5–15.5)
WBC: 5.8 10*3/uL (ref 4.0–10.5)
nRBC: 0 % (ref 0.0–0.2)

## 2022-04-26 LAB — ETHANOL: Alcohol, Ethyl (B): <10 mg/dL (ref <10)

## 2022-04-26 MED ORDER — SODIUM CHLORIDE 0.9 % IV BOLUS
1000.0000 mL | Freq: Once | INTRAVENOUS | Status: AC
  Administered 2022-04-26: 1000 mL via INTRAVENOUS
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
## 2022-04-26 NOTE — ED Notes (Signed)
Pt has extremely sclerotic veins with multiple track marks. EDP at bedside for U/S IV.
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
## 2022-04-26 NOTE — Discharge Instructions (Addendum)
Your lab tests and CT scan of the head were all okay today.  Please follow-up with neurology for further evaluation of your symptoms.  Continue taking all of your home medications as prescribed.  Return to the emergency department if you have any new or worsening symptoms.  Drink lots of fluids to stay hydrated.
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
## 2022-04-26 NOTE — ED Notes (Signed)
Dc ppw provided to patient. Pt questions and followup reviewed. Pt dressed and to lobby alert and oriented in wheelchair.
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
## 2023-01-11 ENCOUNTER — Other Ambulatory Visit: Payer: Self-pay

## 2023-01-11 DIAGNOSIS — Z5321 Procedure and treatment not carried out due to patient leaving prior to being seen by health care provider: Secondary | ICD-10-CM | POA: Insufficient documentation

## 2023-01-11 DIAGNOSIS — M549 Dorsalgia, unspecified: Secondary | ICD-10-CM | POA: Insufficient documentation
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
## 2023-01-12 ENCOUNTER — Emergency Department
Admission: EM | Admit: 2023-01-12 | Discharge: 2023-01-12 | Discharge status: Against Medical Advice | Payer: Medicaid Other | Attending: Emergency Medicine | Admitting: Emergency Medicine

## 2024-10-16 ENCOUNTER — Emergency Department
Admission: EM | Admit: 2024-10-16 | Discharge: 2024-10-16 | Disposition: A | Discharge status: Home / Self Care | Attending: Emergency Medicine | Admitting: Emergency Medicine

## 2024-10-16 ENCOUNTER — Other Ambulatory Visit: Payer: Self-pay

## 2024-10-16 DIAGNOSIS — Z5321 Procedure and treatment not carried out due to patient leaving prior to being seen by health care provider: Secondary | ICD-10-CM | POA: Insufficient documentation

## 2024-10-16 DIAGNOSIS — S0086XA Insect bite (nonvenomous) of other part of head, initial encounter: Secondary | ICD-10-CM | POA: Insufficient documentation

## 2024-10-16 DIAGNOSIS — W57XXXA Bitten or stung by nonvenomous insect and other nonvenomous arthropods, initial encounter: Secondary | ICD-10-CM | POA: Diagnosis not present

## 2024-10-16 HISTORY — DX: Cleft palate, unspecified: Q35.9

## 2024-10-16 LAB — BASIC METABOLIC PANEL WITH GFR
Anion gap: 9 (ref 5–15)
BUN: 12 mg/dL (ref 6–20)
CO2: 24 mmol/L (ref 22–32)
Calcium: 9.1 mg/dL (ref 8.9–10.3)
Chloride: 102 mmol/L (ref 98–111)
Creatinine, Ser: 0.67 mg/dL (ref 0.44–1.00)
GFR, Estimated: >60 mL/min
Glucose, Bld: 100 mg/dL — ABNORMAL HIGH (ref 70–99)
Potassium: 3.9 mmol/L (ref 3.5–5.1)
Sodium: 135 mmol/L (ref 135–145)

## 2024-10-16 LAB — CBC WITH DIFFERENTIAL/PLATELET
Abs Immature Granulocytes: 0.01 K/uL (ref 0.00–0.07)
Basophils Absolute: 0.1 K/uL (ref 0.0–0.1)
Basophils Relative: 1 %
Eosinophils Absolute: 0.2 K/uL (ref 0.0–0.5)
Eosinophils Relative: 2 %
HCT: 40.9 % (ref 36.0–46.0)
Hemoglobin: 14.3 g/dL (ref 12.0–15.0)
Immature Granulocytes: 0 %
Lymphocytes Relative: 23 %
Lymphs Abs: 2.1 K/uL (ref 0.7–4.0)
MCH: 30.2 pg (ref 26.0–34.0)
MCHC: 35 g/dL (ref 30.0–36.0)
MCV: 86.5 fL (ref 80.0–100.0)
Monocytes Absolute: 0.8 K/uL (ref 0.1–1.0)
Monocytes Relative: 8 %
Neutro Abs: 6 K/uL (ref 1.7–7.7)
Neutrophils Relative %: 66 %
Platelets: 270 K/uL (ref 150–400)
RBC: 4.73 MIL/uL (ref 3.87–5.11)
RDW: 12.4 % (ref 11.5–15.5)
WBC: 9.1 K/uL (ref 4.0–10.5)
nRBC: 0 % (ref 0.0–0.2)

## 2024-10-16 NOTE — ED Triage Notes (Signed)
 Pt to ED for insect bite to R cheek 3 days ago with noticeable swelling to area. States pain is radiating to R eye and jaw. Voice sounds muffled pt states this is her baseline because she has cleft palate. Respirations are unlabored.
